# Patient Record
Sex: Female | Born: 1965
Health system: Southern US, Community
[De-identification: ages and names within clinical notes are randomized; demographics above are authoritative.]

## PROBLEM LIST (undated history)

## (undated) DIAGNOSIS — T4145XA Adverse effect of unspecified anesthetic, initial encounter: Secondary | ICD-10-CM

## (undated) DIAGNOSIS — M1990 Unspecified osteoarthritis, unspecified site: Secondary | ICD-10-CM

## (undated) DIAGNOSIS — D649 Anemia, unspecified: Secondary | ICD-10-CM

## (undated) DIAGNOSIS — R51 Headache: Secondary | ICD-10-CM

## (undated) DIAGNOSIS — E039 Hypothyroidism, unspecified: Secondary | ICD-10-CM

## (undated) DIAGNOSIS — J449 Chronic obstructive pulmonary disease, unspecified: Secondary | ICD-10-CM

## (undated) DIAGNOSIS — E079 Disorder of thyroid, unspecified: Secondary | ICD-10-CM

## (undated) DIAGNOSIS — I10 Essential (primary) hypertension: Secondary | ICD-10-CM

## (undated) DIAGNOSIS — T8859XA Other complications of anesthesia, initial encounter: Secondary | ICD-10-CM

## (undated) DIAGNOSIS — Z9289 Personal history of other medical treatment: Secondary | ICD-10-CM

## (undated) HISTORY — PX: WISDOM TOOTH EXTRACTION: SHX21

## (undated) HISTORY — PX: BACK SURGERY: SHX140

## (undated) HISTORY — PX: APPENDECTOMY: SHX54

## (undated) HISTORY — PX: TYMPANOSTOMY TUBE PLACEMENT: SHX32

## (undated) HISTORY — PX: CHOLECYSTECTOMY: SHX55

## (undated) HISTORY — PX: TONSILLECTOMY: SUR1361

---

## 2004-07-10 ENCOUNTER — Encounter: Admission: RE | Admit: 2004-07-10 | Discharge: 2004-07-10 | Payer: Self-pay | Admitting: Family Medicine

## 2004-08-09 ENCOUNTER — Encounter: Admission: RE | Admit: 2004-08-09 | Discharge: 2004-08-09 | Payer: Self-pay | Admitting: Family Medicine

## 2006-05-30 IMAGING — CR DG LUMBAR SPINE COMPLETE 4+V
5 series · 5 of 5 positions shown · non-contrast
Comparison: None.

CLINICAL DATA: Left-sided back pain. 
 LUMBAR SPINE COMPLETE

[view not recorded (1 of 5)]
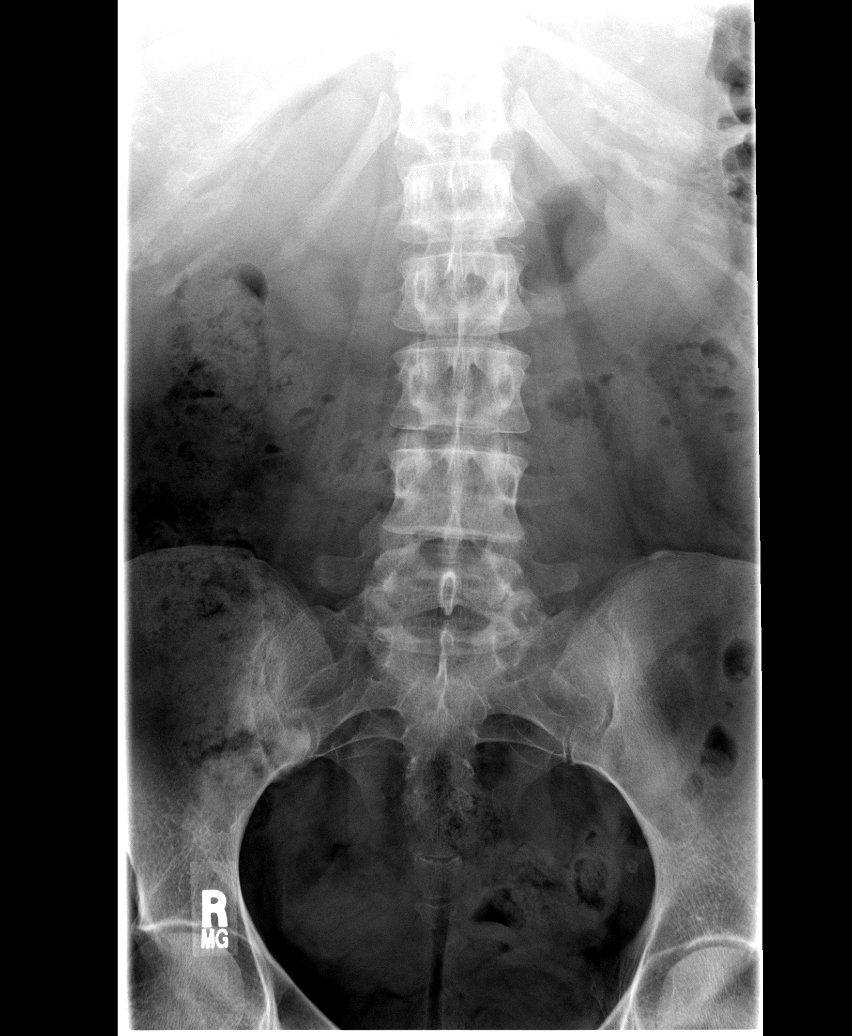

[view not recorded (2 of 5)]
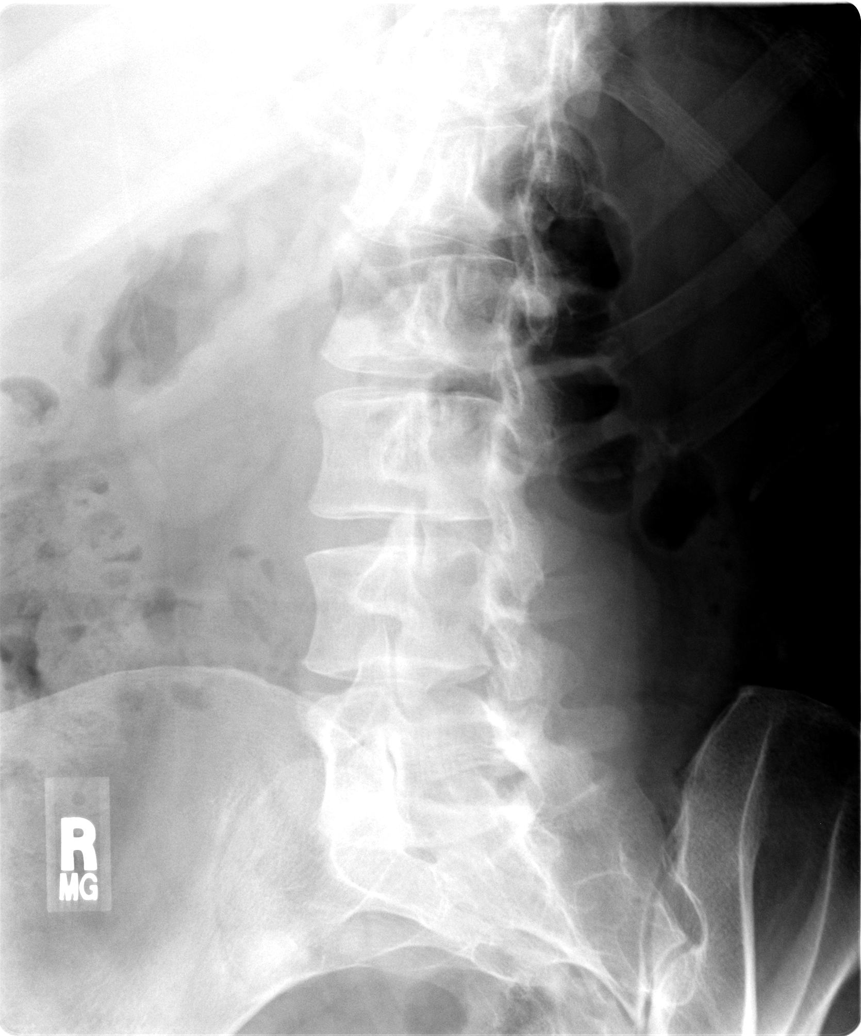

[view not recorded (3 of 5)]
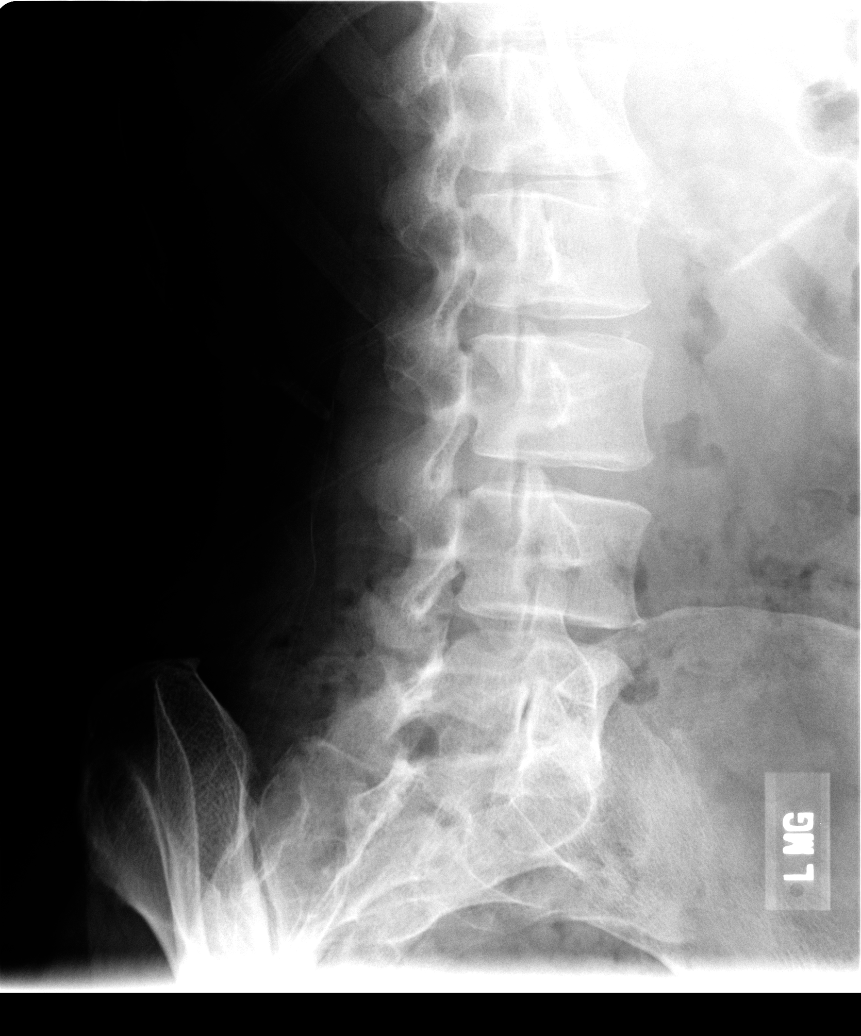

[view not recorded (4 of 5)]
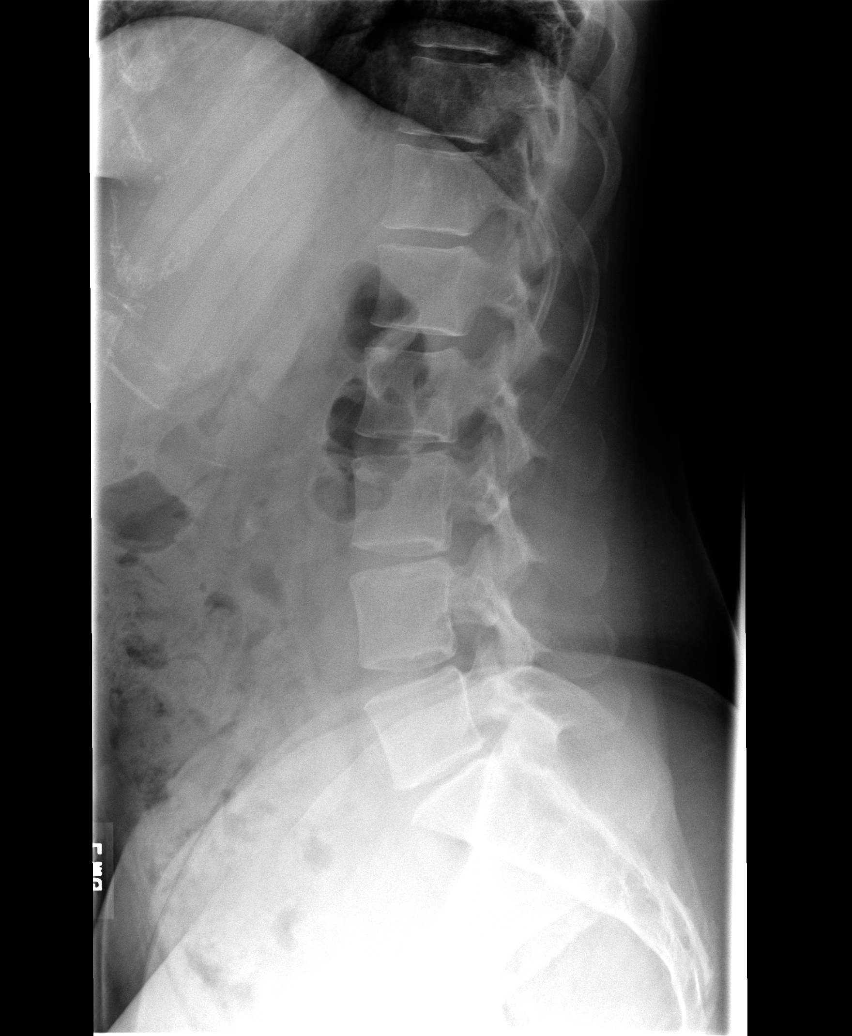

[view not recorded (5 of 5)]
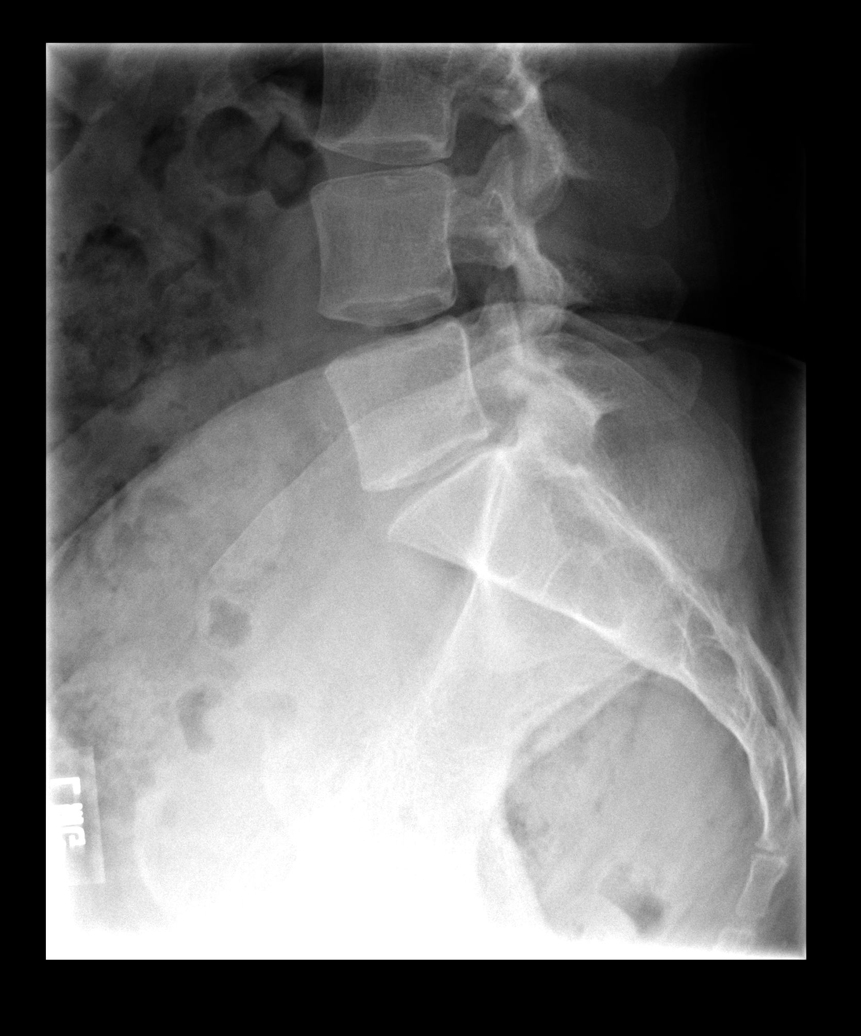

[5 of 5 positions shown; findings below may reference images not displayed]

There is no evidence of fracture. Alignment is normal. The intervertebral disk spaces are within normal limits and no other significant bone abnormalities are identified. 

 IMPRESSION

 Normal study.

## 2008-04-14 ENCOUNTER — Ambulatory Visit (HOSPITAL_COMMUNITY): Admission: RE | Admit: 2008-04-14 | Discharge: 2008-04-14 | Payer: Self-pay | Admitting: Family Medicine

## 2008-09-26 ENCOUNTER — Emergency Department (HOSPITAL_COMMUNITY): Admission: EM | Admit: 2008-09-26 | Discharge: 2008-09-26 | Payer: Self-pay | Admitting: *Deleted

## 2008-10-26 ENCOUNTER — Ambulatory Visit (HOSPITAL_COMMUNITY): Admission: RE | Admit: 2008-10-26 | Discharge: 2008-10-26 | Payer: Self-pay | Admitting: General Surgery

## 2008-10-26 ENCOUNTER — Encounter (INDEPENDENT_AMBULATORY_CARE_PROVIDER_SITE_OTHER): Payer: Self-pay | Admitting: General Surgery

## 2009-07-18 ENCOUNTER — Other Ambulatory Visit: Admission: RE | Admit: 2009-07-18 | Discharge: 2009-07-18 | Payer: Self-pay | Admitting: Obstetrics and Gynecology

## 2009-07-27 ENCOUNTER — Encounter: Admission: RE | Admit: 2009-07-27 | Discharge: 2009-07-27 | Payer: Self-pay | Admitting: Obstetrics and Gynecology

## 2010-04-15 ENCOUNTER — Emergency Department (HOSPITAL_COMMUNITY): Admission: EM | Admit: 2010-04-15 | Discharge: 2010-04-15 | Payer: Self-pay | Admitting: Family Medicine

## 2010-08-16 IMAGING — US US ABDOMEN COMPLETE
1 series · 14 of 25 positions shown · non-contrast
Comparison: None

CLINICAL DATA: Abdominal pain and nausea.

ABDOMEN ULTRASOUND
TECHNIQUE: Complete abdominal ultrasound examination was performed
including evaluation of the liver, gallbladder, bile ducts,
pancreas, kidneys, spleen, IVC, and abdominal aorta.

[Series 1: unknown · 0.33mm/px · 14 of 66 slices shown]
[im 1/66]
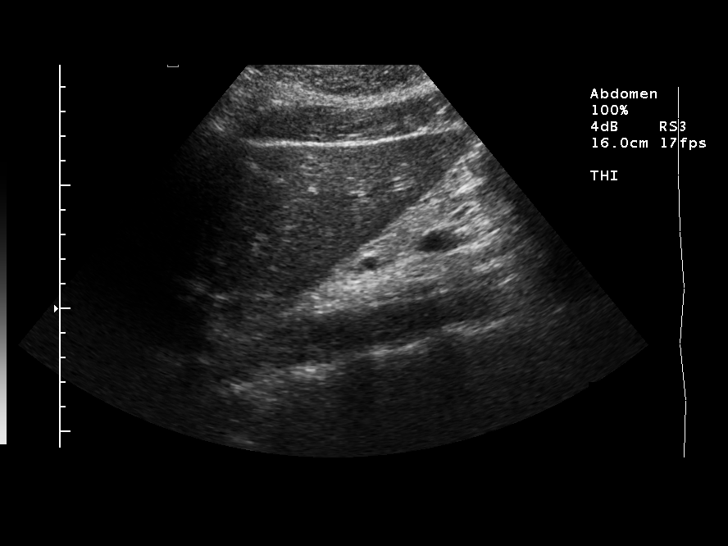
[im 6/66]
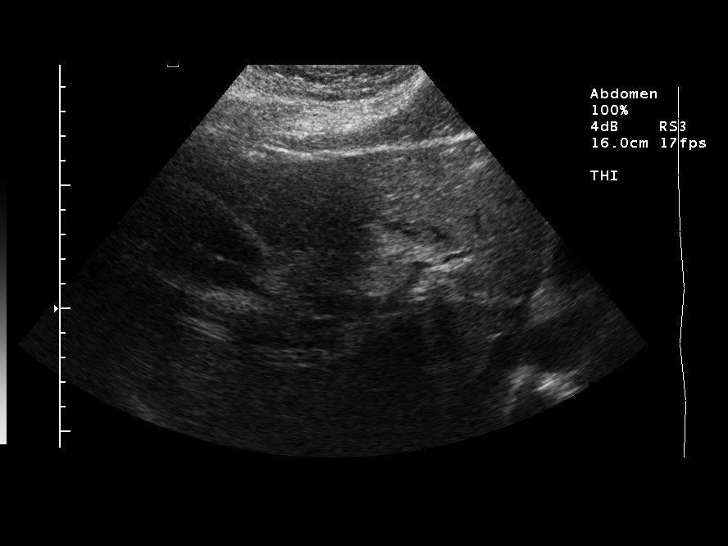
[im 11/66]
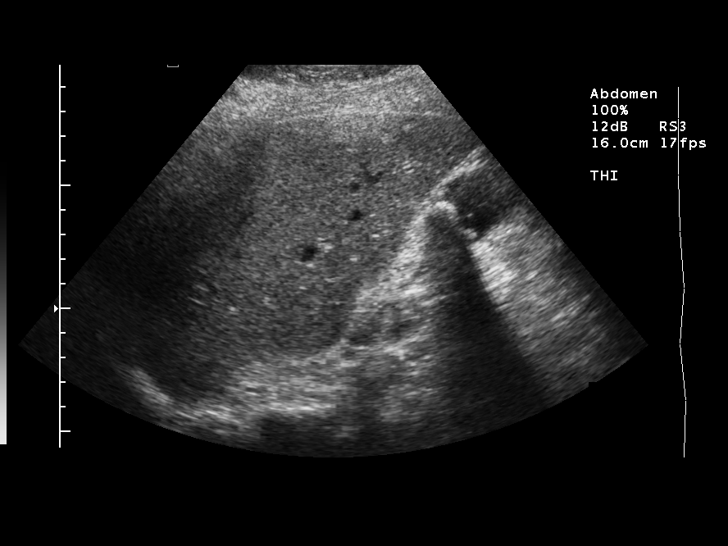
[im 17/66]
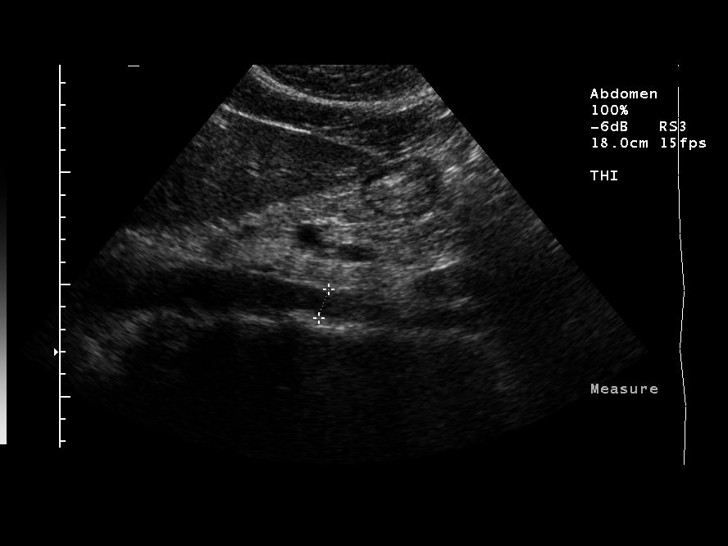
[im 22/66]
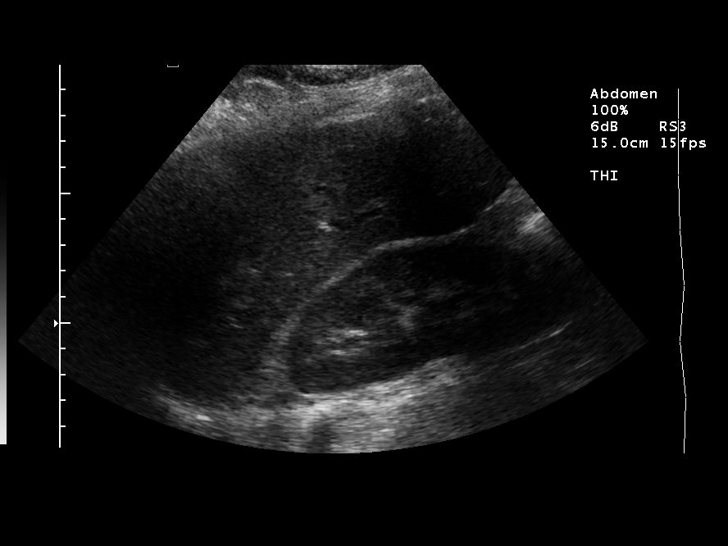
[im 25/66]
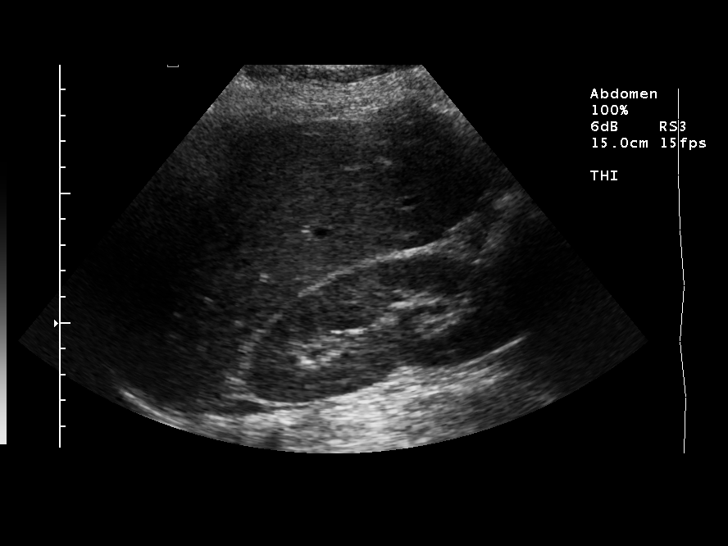
[im 30/66]
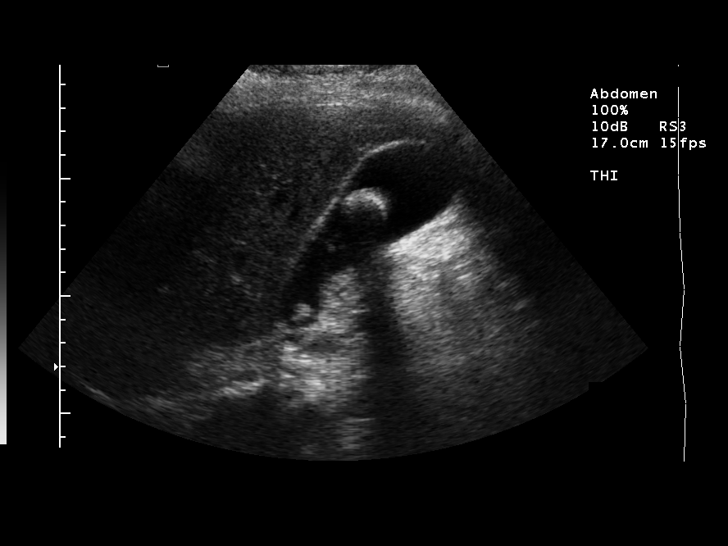
[im 36/66]
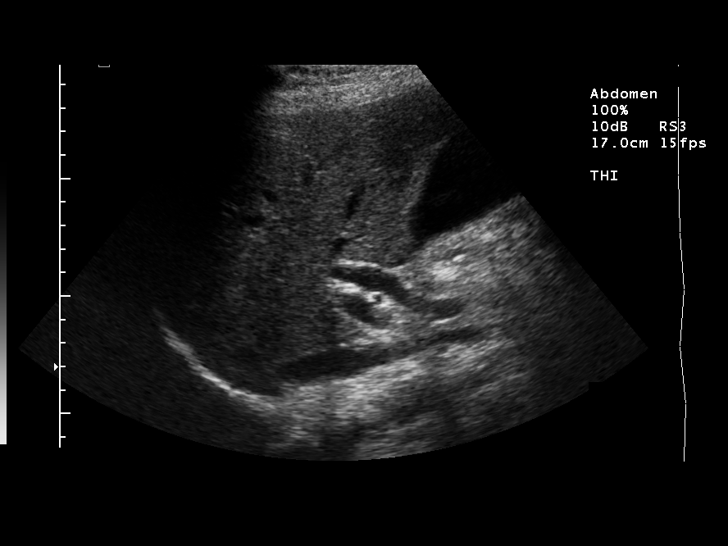
[im 41/66]
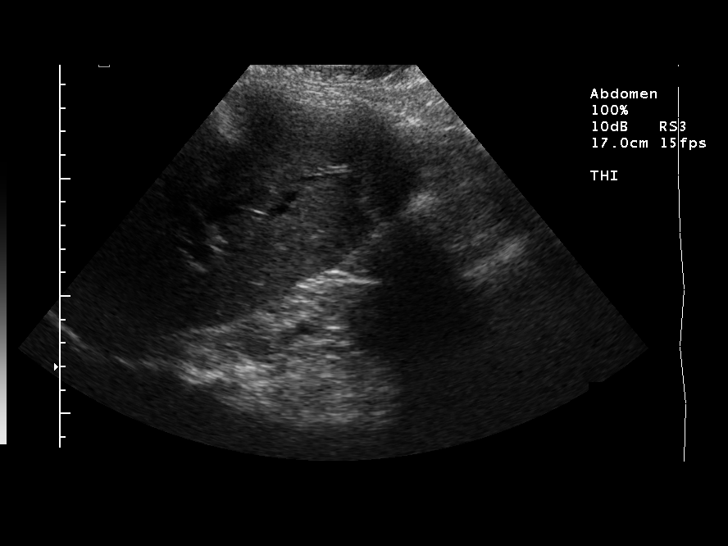
[im 44/66]
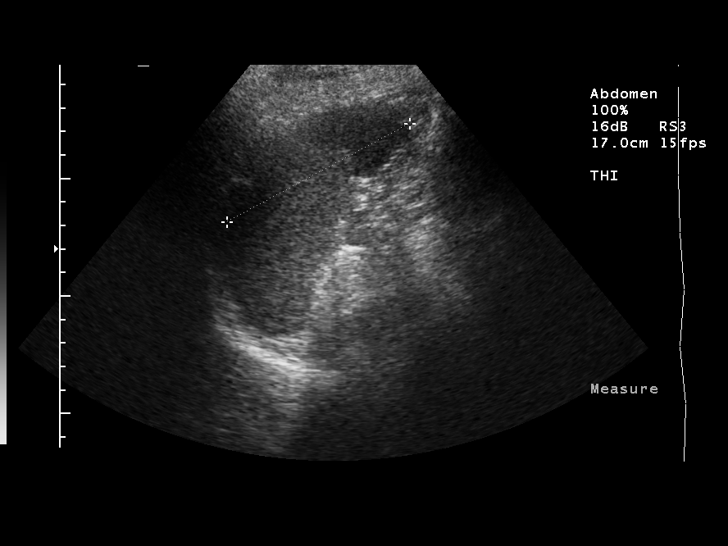
[im 49/66]
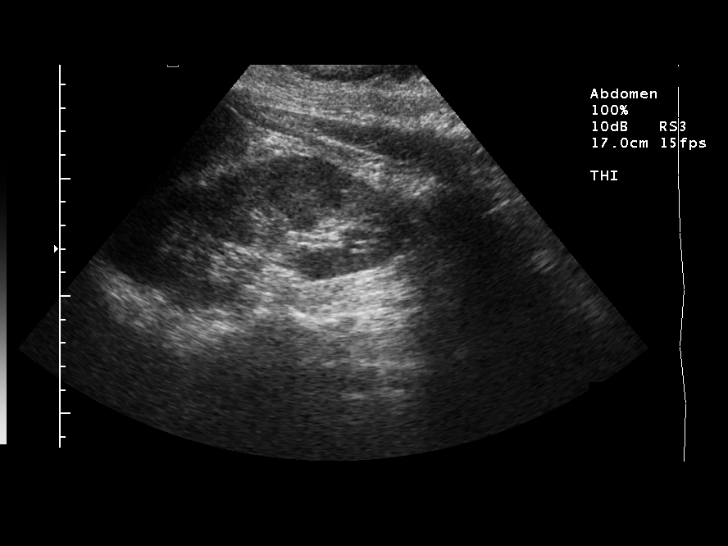
[im 55/66]
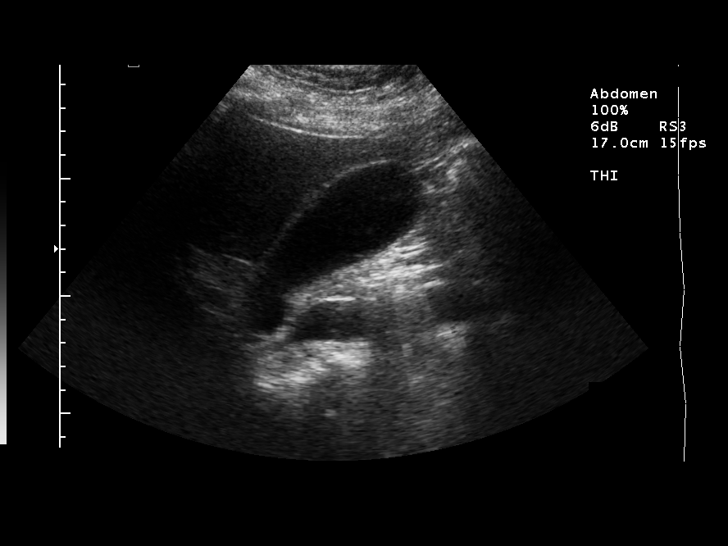
[im 60/66]
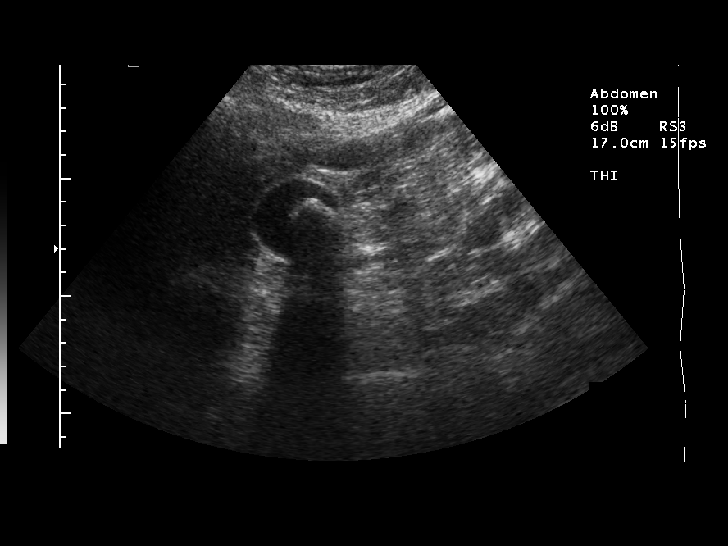
[im 66/66]
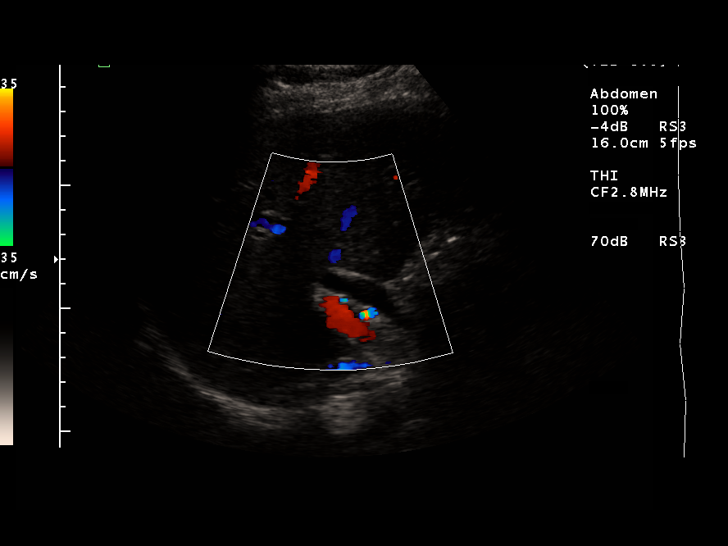

[14 of 25 positions shown; findings below may reference images not displayed]

FINDINGS: Multiple gallstones are noted with the largest gallstone
measuring 2 cm.
There is no evidence of gallbladder wall thickening,
pericholecystic fluid, or sonographic Murphy's sign.

There is dilatation of the CBD (9 mm) and mild intrahepatic biliary
dilatation without  identifiable cause.

The remainder of the liver is unremarkable.
The IVC, pancreas, spleen, kidneys, and abdominal aorta are
unremarkable.

There is no evidence of free fluid.
IMPRESSION: CBD and intrahepatic biliary dilatation without identifiable cause.
Choledocholithiasis are not identified , but not excluded
especially distally.

Cholelithiasis without evidence of acute cholecystitis.

## 2010-12-06 ENCOUNTER — Other Ambulatory Visit: Payer: Self-pay | Admitting: Obstetrics and Gynecology

## 2010-12-06 DIAGNOSIS — Z1231 Encounter for screening mammogram for malignant neoplasm of breast: Secondary | ICD-10-CM

## 2010-12-13 ENCOUNTER — Ambulatory Visit: Payer: Self-pay

## 2010-12-14 LAB — POCT PREGNANCY, URINE: Preg Test, Ur: NEGATIVE

## 2010-12-14 LAB — CBC
HCT: 36.7 % (ref 36.0–46.0)
Hemoglobin: 12 g/dL (ref 12.0–15.0)
MCH: 24.2 pg — ABNORMAL LOW (ref 26.0–34.0)
MCHC: 32.8 g/dL (ref 30.0–36.0)
MCV: 73.8 fL — ABNORMAL LOW (ref 78.0–100.0)
Platelets: 428 10*3/uL — ABNORMAL HIGH (ref 150–400)
RBC: 4.98 MIL/uL (ref 3.87–5.11)
RDW: 21.2 % — ABNORMAL HIGH (ref 11.5–15.5)
WBC: 13.9 10*3/uL — ABNORMAL HIGH (ref 4.0–10.5)

## 2010-12-14 LAB — COMPREHENSIVE METABOLIC PANEL
ALT: 34 U/L (ref 0–35)
AST: 25 U/L (ref 0–37)
Albumin: 4.5 g/dL (ref 3.5–5.2)
Alkaline Phosphatase: 121 U/L — ABNORMAL HIGH (ref 39–117)
BUN: 5 mg/dL — ABNORMAL LOW (ref 6–23)
CO2: 24 mEq/L (ref 19–32)
Calcium: 9.4 mg/dL (ref 8.4–10.5)
Chloride: 102 mEq/L (ref 96–112)
Creatinine, Ser: 0.56 mg/dL (ref 0.4–1.2)
GFR calc Af Amer: >60 mL/min (ref >60)
GFR calc non Af Amer: >60 mL/min (ref >60)
Glucose, Bld: 104 mg/dL — ABNORMAL HIGH (ref 70–99)
Potassium: 3.8 mEq/L (ref 3.5–5.1)
Sodium: 136 mEq/L (ref 135–145)
Total Bilirubin: 0.6 mg/dL (ref 0.3–1.2)
Total Protein: 7.3 g/dL (ref 6.0–8.3)

## 2010-12-14 LAB — DIFFERENTIAL
Basophils Absolute: 0 10*3/uL (ref 0.0–0.1)
Basophils Relative: 0 % (ref 0–1)
Eosinophils Absolute: 0.1 10*3/uL (ref 0.0–0.7)
Eosinophils Relative: 1 % (ref 0–5)
Lymphocytes Relative: 9 % — ABNORMAL LOW (ref 12–46)
Lymphs Abs: 1.3 10*3/uL (ref 0.7–4.0)
Monocytes Absolute: 0.6 10*3/uL (ref 0.1–1.0)
Monocytes Relative: 4 % (ref 3–12)
Neutro Abs: 11.9 10*3/uL — ABNORMAL HIGH (ref 1.7–7.7)
Neutrophils Relative %: 86 % — ABNORMAL HIGH (ref 43–77)

## 2010-12-14 LAB — POCT URINALYSIS DIP (DEVICE)
Bilirubin Urine: NEGATIVE
Glucose, UA: NEGATIVE mg/dL
Ketones, ur: NEGATIVE mg/dL
Nitrite: NEGATIVE
Protein, ur: NEGATIVE mg/dL
Specific Gravity, Urine: 1.02 (ref 1.005–1.030)
Urobilinogen, UA: 0.2 mg/dL (ref 0.0–1.0)
pH: 5.5 (ref 5.0–8.0)

## 2010-12-14 LAB — LIPASE, BLOOD: Lipase: 18 U/L (ref 11–59)

## 2010-12-20 ENCOUNTER — Ambulatory Visit
Admission: RE | Admit: 2010-12-20 | Discharge: 2010-12-20 | Disposition: A | Discharge status: Home / Self Care | Payer: BC Managed Care – PPO | Source: Ambulatory Visit | Attending: Obstetrics and Gynecology | Admitting: Obstetrics and Gynecology

## 2010-12-20 DIAGNOSIS — Z1231 Encounter for screening mammogram for malignant neoplasm of breast: Secondary | ICD-10-CM

## 2011-01-13 LAB — COMPREHENSIVE METABOLIC PANEL
ALT: 18 U/L (ref 0–35)
AST: 20 U/L (ref 0–37)
Albumin: 3.6 g/dL (ref 3.5–5.2)
Alkaline Phosphatase: 84 U/L (ref 39–117)
BUN: 13 mg/dL (ref 6–23)
CO2: 23 mEq/L (ref 19–32)
Calcium: 8.9 mg/dL (ref 8.4–10.5)
Chloride: 109 mEq/L (ref 96–112)
Creatinine, Ser: 0.42 mg/dL (ref 0.4–1.2)
GFR calc Af Amer: >60 mL/min (ref >60)
GFR calc non Af Amer: >60 mL/min (ref >60)
Glucose, Bld: 109 mg/dL — ABNORMAL HIGH (ref 70–99)
Potassium: 4 mEq/L (ref 3.5–5.1)
Sodium: 139 mEq/L (ref 135–145)
Total Bilirubin: 0.6 mg/dL (ref 0.3–1.2)
Total Protein: 5.8 g/dL — ABNORMAL LOW (ref 6.0–8.3)

## 2011-01-13 LAB — DIFFERENTIAL
Basophils Absolute: 0.1 10*3/uL (ref 0.0–0.1)
Basophils Relative: 1 % (ref 0–1)
Eosinophils Absolute: 0.3 10*3/uL (ref 0.0–0.7)
Eosinophils Relative: 4 % (ref 0–5)
Lymphocytes Relative: 25 % (ref 12–46)
Lymphs Abs: 1.6 10*3/uL (ref 0.7–4.0)
Monocytes Absolute: 0.5 10*3/uL (ref 0.1–1.0)
Monocytes Relative: 8 % (ref 3–12)
Neutro Abs: 4 10*3/uL (ref 1.7–7.7)
Neutrophils Relative %: 62 % (ref 43–77)

## 2011-01-13 LAB — CBC
HCT: 29 % — ABNORMAL LOW (ref 36.0–46.0)
Hemoglobin: 9.1 g/dL — ABNORMAL LOW (ref 12.0–15.0)
MCHC: 31.5 g/dL (ref 30.0–36.0)
MCV: 66.2 fL — ABNORMAL LOW (ref 78.0–100.0)
Platelets: 305 10*3/uL (ref 150–400)
RBC: 4.38 MIL/uL (ref 3.87–5.11)
RDW: 19.3 % — ABNORMAL HIGH (ref 11.5–15.5)
WBC: 6.5 10*3/uL (ref 4.0–10.5)

## 2011-01-13 LAB — PREGNANCY, URINE: Preg Test, Ur: NEGATIVE

## 2011-01-17 ENCOUNTER — Other Ambulatory Visit (HOSPITAL_COMMUNITY)
Admission: RE | Admit: 2011-01-17 | Discharge: 2011-01-17 | Disposition: A | Discharge status: Home / Self Care | Payer: BC Managed Care – PPO | Source: Ambulatory Visit | Attending: Obstetrics and Gynecology | Admitting: Obstetrics and Gynecology

## 2011-01-17 ENCOUNTER — Other Ambulatory Visit: Payer: Self-pay | Admitting: Obstetrics and Gynecology

## 2011-01-17 DIAGNOSIS — Z01419 Encounter for gynecological examination (general) (routine) without abnormal findings: Secondary | ICD-10-CM | POA: Insufficient documentation

## 2011-02-11 NOTE — Op Note (Signed)
NAMECORINNE, GOUCHER               ACCOUNT NO.:  [id number]   MEDICAL RECORD NO.:  [id number]          PATIENT TYPE:  AMB   LOCATION:  DAY                          FACILITY:  [facility]   PHYSICIAN:  Anselm Pancoast. Weatherly, M.D.DATE OF BIRTH:  01-21-1966   DATE OF PROCEDURE:  10/26/2008  DATE OF DISCHARGE:                               OPERATIVE REPORT   PREOPERATIVE DIAGNOSIS:  Chronic cholecystitis with stones, possible  passage of common duct stone recently.   POSTOPERATIVE DIAGNOSIS:  Chronic cholecystitis with stones, possible  passage of common duct stone recently.   OPERATIONS:  Laparoscopic cholecystectomy with cholangiogram.   SURGEON:  Consuello Bossier, M.D.   ASSISTANT:  Nurse.   HISTORY:  Korbin Notaro is a 45 year old Caucasian female who was  referred to our office after having episodes of significant epigastric  pain.  One of the episodes went definitely up to the chest.  She was  referred to our office.  She went to the emergency room when she was  having the episode of pain.  They did lab studies and an ultrasound.  The laboratory studies were abnormal as far as the liver function  studies.  The bilirubin was slightly elevated with the SGOT and SGPT  significantly elevated.  Her symptoms have gradually decreased and when  I saw her in the office I discussed with her that she probably had  passed a common duct stone with the pain, the pain up in the chest and  etc.  She has had previous back surgery by Dr. Phoebe Perch for a ruptured  disk and no evidence of any intra-abdominal surgery.  Her mother had  gallstones.  She is here today for the planned procedure.  She has been  seen by the [facility] after the emergency room and they did an  iron study.  Her iron saturation is low and she is anemic, but she has  not actually been given iron.  Dr. Manus Gunning is her regular physician.  I  recommended we proceed on with the laparoscopic cholecystectomy and  cholangiogram.  She  is aware that she may need an ERCP.   DESCRIPTION OF PROCEDURE:  The patient was taken to the operative suite.  She has got PAS stockings.  She had received 3 grams of Unasyn  preoperatively.  After induction of general anesthesia endotracheal tube  an oral tube was placed into the stomach and then the abdomen was  prepped with Betadine solution and draped in a sterile manner.  A small  incision was made below the umbilicus.  The fascia was identified and  picked up between two Kochers and carefully entered into the subfascial  area.  I placed a pursestring suture.  The peritoneum was not completely  entered at first.  I put my finger in and increased the opening just a  little bit and then repositioned the Hasson cannula.  The gallbladder is  distended.  It is not acutely inflamed.  She has got kind of a prominent  liver with the left lobe kind of hanging down, but I was able to get the  trocar  in the substernal area.  The two lateral 5 mm trocars were placed  and the gallbladder was grasped and retracted laterally.  Fortunately  the left lobe kind of hanging down and I could kind of work between the  lobes since I could not really elevate the prominence on the left.  Then  I could open up the peritoneum.  I identified the junction of the cystic  duct and the gallbladder and then put a clip flush with the cystic duct.  In the little cystic artery I put a single clip.  I then made a little  opening in the cystic bile duct proximal to the clip and there was a  soft stone in the cystic duct that was kind of milked out.  There was  good bile flow then.  The [facility] catheter was introduced, held in place  with a clip and then a cholangiogram obtained.  The cholangiogram showed  a very prominent common hepatic.  The intrahepatic radicals did fill.  There was probably about a 2 to 3 cm cystic duct.  On looking closely at  the distal area you could see that there are two; one stone is probably   about 2 mm and the other one is a little bit more prominent.  Then I  repositioned it and got some glucagon and gave her the glucagon, waited  5 minutes and then reinjected and then you could see the stone well  visualized.  Then the stone appeared to most likely pass since there was  no evidence of any other filling defect, there was good flow into the  duodenum.  I reviewed this with Dr. Audie Pinto and Dr. Deanne Coffer actually  read it and they feel that there is no common duct stone after the last  run and hopefully she has not going to need an ERCP.  The cystic duct  was doubly clipped proximally.  I did put a PDS Endoloop on it since the  common bile duct is a little more prominent in case there would be a  common duct stone also elevating the pressure.  I think that the best  route would be to not plan on doing an ERCP, repeat liver function  studies tomorrow and will do the ERCP if she is having further pain or  abnormal LFTs since I think that she passed these two little common duct  stones.  The cystic artery was doubly clipped proximally and distally  divided.  A little posterior branch was clipped under direct vision.  The peritoneum opened up and the dilated gallbladder freed from its bed  with good hemostasis.  I placed the gallbladder within an EndoCatch bag.  I switched the camera to the upper port and withdrew the bag containing  the gallbladder and stones.  The patient tolerated the procedure well.  The CO2 was released.  I put an additional figure-of-eight of 0 Vicryl  in the fascia at the umbilicus, a single stitch in the fascia and  subxiphoid area and had removed the little irrigating fluid.  She hopes  to go home afterwards.  The subcuticular wounds were closed with 4-0  Vicryl and Steri-Strips for skin.           ______________________________  Anselm Pancoast. Zachery Dakins, M.D.     WJW/MEDQ  D:  10/26/2008  T:  10/26/2008  Job:  [id number]   cc:   Bryan Lemma. Manus Gunning, M.D.   Fax: [phone]

## 2011-07-04 LAB — COMPREHENSIVE METABOLIC PANEL
ALT: 567 U/L — ABNORMAL HIGH (ref 0–35)
AST: 747 U/L — ABNORMAL HIGH (ref 0–37)
Albumin: 4.1 g/dL (ref 3.5–5.2)
Alkaline Phosphatase: 220 U/L — ABNORMAL HIGH (ref 39–117)
BUN: 7 mg/dL (ref 6–23)
CO2: 20 mEq/L (ref 19–32)
Calcium: 9.1 mg/dL (ref 8.4–10.5)
Chloride: 103 mEq/L (ref 96–112)
Creatinine, Ser: 0.45 mg/dL (ref 0.4–1.2)
GFR calc Af Amer: >60 mL/min (ref >60)
GFR calc non Af Amer: >60 mL/min (ref >60)
Glucose, Bld: 129 mg/dL — ABNORMAL HIGH (ref 70–99)
Potassium: 3.7 mEq/L (ref 3.5–5.1)
Sodium: 134 mEq/L — ABNORMAL LOW (ref 135–145)
Total Bilirubin: 2.2 mg/dL — ABNORMAL HIGH (ref 0.3–1.2)
Total Protein: 7 g/dL (ref 6.0–8.3)

## 2011-07-04 LAB — DIFFERENTIAL
Band Neutrophils: 0 % (ref 0–10)
Basophils Absolute: 0 10*3/uL (ref 0.0–0.1)
Basophils Relative: 0 % (ref 0–1)
Blasts: 0 %
Eosinophils Absolute: 0.1 10*3/uL (ref 0.0–0.7)
Eosinophils Relative: 2 % (ref 0–5)
Lymphocytes Relative: 15 % (ref 12–46)
Lymphs Abs: 1.1 10*3/uL (ref 0.7–4.0)
Metamyelocytes Relative: 0 %
Monocytes Absolute: 0.2 10*3/uL (ref 0.1–1.0)
Monocytes Relative: 3 % (ref 3–12)
Myelocytes: 0 %
Neutro Abs: 6 10*3/uL (ref 1.7–7.7)
Neutrophils Relative %: 80 % — ABNORMAL HIGH (ref 43–77)
Promyelocytes Absolute: 0 %
nRBC: 0 /100 WBC

## 2011-07-04 LAB — URINALYSIS, ROUTINE W REFLEX MICROSCOPIC
Glucose, UA: NEGATIVE mg/dL
Ketones, ur: NEGATIVE mg/dL
Nitrite: NEGATIVE
Protein, ur: NEGATIVE mg/dL
Specific Gravity, Urine: 1.013 (ref 1.005–1.030)
Urobilinogen, UA: 1 mg/dL (ref 0.0–1.0)
pH: 6.5 (ref 5.0–8.0)

## 2011-07-04 LAB — CBC
HCT: 31.8 % — ABNORMAL LOW (ref 36.0–46.0)
Hemoglobin: 10.1 g/dL — ABNORMAL LOW (ref 12.0–15.0)
MCHC: 31.8 g/dL (ref 30.0–36.0)
MCV: 65.4 fL — ABNORMAL LOW (ref 78.0–100.0)
Platelets: 434 10*3/uL — ABNORMAL HIGH (ref 150–400)
RBC: 4.86 MIL/uL (ref 3.87–5.11)
RDW: 19.6 % — ABNORMAL HIGH (ref 11.5–15.5)
WBC: 7.4 10*3/uL (ref 4.0–10.5)

## 2011-07-04 LAB — URINE MICROSCOPIC-ADD ON

## 2011-07-04 LAB — PREGNANCY, URINE: Preg Test, Ur: NEGATIVE

## 2011-07-04 LAB — LIPASE, BLOOD: Lipase: 15 U/L (ref 11–59)

## 2012-01-23 ENCOUNTER — Other Ambulatory Visit: Payer: Self-pay | Admitting: Obstetrics and Gynecology

## 2012-01-23 ENCOUNTER — Other Ambulatory Visit (HOSPITAL_COMMUNITY)
Admission: RE | Admit: 2012-01-23 | Discharge: 2012-01-23 | Disposition: A | Discharge status: Home / Self Care | Payer: BC Managed Care – PPO | Source: Ambulatory Visit | Attending: Obstetrics and Gynecology | Admitting: Obstetrics and Gynecology

## 2012-01-23 DIAGNOSIS — Z01419 Encounter for gynecological examination (general) (routine) without abnormal findings: Secondary | ICD-10-CM | POA: Insufficient documentation

## 2012-02-06 ENCOUNTER — Other Ambulatory Visit (HOSPITAL_COMMUNITY): Payer: Self-pay | Admitting: Radiology

## 2012-02-06 DIAGNOSIS — J449 Chronic obstructive pulmonary disease, unspecified: Secondary | ICD-10-CM

## 2012-02-13 ENCOUNTER — Encounter (HOSPITAL_COMMUNITY): Payer: BC Managed Care – PPO

## 2012-02-20 ENCOUNTER — Other Ambulatory Visit: Payer: Self-pay | Admitting: Obstetrics and Gynecology

## 2012-02-20 DIAGNOSIS — Z1231 Encounter for screening mammogram for malignant neoplasm of breast: Secondary | ICD-10-CM

## 2012-03-05 ENCOUNTER — Encounter (HOSPITAL_COMMUNITY): Payer: BC Managed Care – PPO

## 2012-03-19 ENCOUNTER — Encounter (HOSPITAL_COMMUNITY): Payer: BC Managed Care – PPO

## 2012-03-19 ENCOUNTER — Ambulatory Visit
Admission: RE | Admit: 2012-03-19 | Discharge: 2012-03-19 | Disposition: A | Discharge status: Home / Self Care | Payer: BC Managed Care – PPO | Source: Ambulatory Visit | Attending: Obstetrics and Gynecology | Admitting: Obstetrics and Gynecology

## 2012-03-19 DIAGNOSIS — Z1231 Encounter for screening mammogram for malignant neoplasm of breast: Secondary | ICD-10-CM

## 2012-04-02 ENCOUNTER — Other Ambulatory Visit (HOSPITAL_COMMUNITY): Payer: Self-pay | Admitting: Respiratory Therapy

## 2012-04-02 ENCOUNTER — Ambulatory Visit (HOSPITAL_COMMUNITY)
Admission: RE | Admit: 2012-04-02 | Discharge: 2012-04-02 | Disposition: A | Discharge status: Home / Self Care | Payer: BC Managed Care – PPO | Source: Ambulatory Visit | Attending: Family Medicine | Admitting: Family Medicine

## 2012-04-02 DIAGNOSIS — J4489 Other specified chronic obstructive pulmonary disease: Secondary | ICD-10-CM | POA: Insufficient documentation

## 2012-04-02 DIAGNOSIS — J449 Chronic obstructive pulmonary disease, unspecified: Secondary | ICD-10-CM | POA: Insufficient documentation

## 2012-04-02 MED ORDER — ALBUTEROL SULFATE (5 MG/ML) 0.5% IN NEBU
2.5000 mg | INHALATION_SOLUTION | Freq: Once | RESPIRATORY_TRACT | Status: AC
  Administered 2012-04-02: 2.5 mg via RESPIRATORY_TRACT

## 2012-07-11 ENCOUNTER — Emergency Department (HOSPITAL_COMMUNITY): Payer: BC Managed Care – PPO

## 2012-07-11 ENCOUNTER — Emergency Department (HOSPITAL_COMMUNITY)
Admission: EM | Admit: 2012-07-11 | Discharge: 2012-07-11 | Disposition: A | Discharge status: Home / Self Care | Payer: BC Managed Care – PPO | Attending: Emergency Medicine | Admitting: Emergency Medicine

## 2012-07-11 ENCOUNTER — Encounter (HOSPITAL_COMMUNITY): Payer: Self-pay | Admitting: Emergency Medicine

## 2012-07-11 DIAGNOSIS — S82899A Other fracture of unspecified lower leg, initial encounter for closed fracture: Secondary | ICD-10-CM

## 2012-07-11 DIAGNOSIS — M25579 Pain in unspecified ankle and joints of unspecified foot: Secondary | ICD-10-CM | POA: Insufficient documentation

## 2012-07-11 DIAGNOSIS — S82853A Displaced trimalleolar fracture of unspecified lower leg, initial encounter for closed fracture: Secondary | ICD-10-CM | POA: Insufficient documentation

## 2012-07-11 DIAGNOSIS — W010XXA Fall on same level from slipping, tripping and stumbling without subsequent striking against object, initial encounter: Secondary | ICD-10-CM | POA: Insufficient documentation

## 2012-07-11 HISTORY — DX: Essential (primary) hypertension: I10

## 2012-07-11 HISTORY — DX: Disorder of thyroid, unspecified: E07.9

## 2012-07-11 MED ORDER — FENTANYL CITRATE 0.05 MG/ML IJ SOLN
50.0000 ug | Freq: Once | INTRAMUSCULAR | Status: AC
  Administered 2012-07-11: 50 ug via INTRAVENOUS
  Filled 2012-07-11: qty 2

## 2012-07-11 MED ORDER — FENTANYL CITRATE 0.05 MG/ML IJ SOLN
25.0000 ug | Freq: Once | INTRAMUSCULAR | Status: AC
  Administered 2012-07-11: 25 ug via INTRAVENOUS
  Filled 2012-07-11: qty 2

## 2012-07-11 MED ORDER — LIDOCAINE HCL 1 % IJ SOLN
5.0000 mL | Freq: Once | INTRAMUSCULAR | Status: AC
  Administered 2012-07-11: 5 mL via INTRADERMAL
  Filled 2012-07-11: qty 20

## 2012-07-11 MED ORDER — HYDROCODONE-ACETAMINOPHEN 10-325 MG PO TABS: 1.0000 | ORAL_TABLET | Freq: Four times a day (QID) | ORAL | Status: DC | PRN

## 2012-07-11 MED ORDER — SODIUM CHLORIDE 0.9 % IV SOLN
Freq: Once | INTRAVENOUS | Status: AC
  Administered 2012-07-11: 01:00:00 via INTRAVENOUS

## 2012-07-11 NOTE — ED Notes (Signed)
Pt given 150mcg of fentanyl by EMS prior to arrival 

## 2012-07-11 NOTE — Progress Notes (Signed)
Orthopedic Tech Progress Note Patient Details:  [name] 12/16/1965 [id number]  Ortho Devices Type of Ortho Device: Short leg splint   [name] 07/11/2012, 1:53 AM

## 2012-07-11 NOTE — ED Notes (Signed)
Pt presents to the Ed via EMS with the c/o ankle pain.  Pain has Etoh on board.  Pt slipped in a field of wet grass and twisted her right ankle.  Pt was brought in on a spine board and cervical collar.  Pt. Removed cervical collar on her own and stated, "I don't care I just need to use the bathroom."  Patients family is at bedside.  Pt taken off the spineboard with assistance of DR. Jeraldine Loots and ED staff. Patient appears stable at this time.

## 2012-07-11 NOTE — ED Provider Notes (Signed)
History     CSN: [id number]  Arrival date & time 07/11/12  0011   First MD Initiated Contact with Patient 07/11/12 0007      Chief Complaint  Patient presents with  . Ankle Pain    (Consider location/radiation/quality/duration/timing/severity/associated sxs/prior treatment) HPI The patient presents with right ankle pain. Just prior to presentation the patient slipped on a wet grassy surface, felt her ankle give way, and since that time she said pain persistently in the ankle.  She's not been ambulatory, has not put weight on the extremity. She denies trauma to other body parts, any loss of consciousness, any other focal complaints. The patient's husband saw the event, he similarly denies any other trauma, loss of consciousness, changes in the patient's interactivity or behavior since the event. History reviewed. No pertinent past medical history.  History reviewed. No pertinent past surgical history.  History reviewed. No pertinent family history.  History  Substance Use Topics  . Smoking status: Not on file  . Smokeless tobacco: Not on file  . Alcohol Use: Not on file    OB History    Grav Para Term Preterm Abortions TAB SAB Ect Mult Living                  Review of Systems  All other systems reviewed and are negative.    Allergies  Review of patient's allergies indicates not on file.  Home Medications  No current outpatient prescriptions on file.  BP 128/87  Pulse 94  Temp 97.7 F (36.5 C) (Oral)  Resp 22  SpO2 96%  Physical Exam  Nursing note and vitals reviewed. Constitutional: She is oriented to person, place, and time. She appears well-developed and well-nourished. Cervical collar and backboard in place.  HENT:  Head: Normocephalic and atraumatic.  Nose: Nose normal.  Mouth/Throat: Uvula is midline and oropharynx is clear and moist.  Neck: Normal range of motion and full passive range of motion without pain. No spinous process tenderness and no  muscular tenderness present. No tracheal deviation present. No thyromegaly present.  Cardiovascular: Normal rate and intact distal pulses.   Pulmonary/Chest: Effort normal. No stridor. No respiratory distress.  Abdominal: She exhibits no distension.  Musculoskeletal:       Right ankle: She exhibits decreased range of motion and deformity. She exhibits no ecchymosis, no laceration and normal pulse. tenderness. Lateral malleolus, medial malleolus, AITFL, CF ligament and posterior TFL tenderness found. No proximal fibula tenderness found.  Lymphadenopathy:    She has no cervical adenopathy.  Neurological: She is alert and oriented to person, place, and time. No cranial nerve deficit. She exhibits normal muscle tone. Coordination normal.  Skin: Skin is warm and dry.  Psychiatric: She has a normal mood and affect.    ED Course  ORTHOPEDIC INJURY TREATMENT Date/Time: 07/11/2012 1:00 AM Performed by: Gerhard Munch Authorized by: Gerhard Munch Consent: Verbal consent obtained. Written consent not obtained. The procedure was performed in an emergent situation. Risks and benefits: risks, benefits and alternatives were discussed Consent given by: patient and spouse Patient identity confirmed: verbally with patient Time out: Immediately prior to procedure a "time out" was called to verify the correct patient, procedure, equipment, support staff and site/side marked as required. Injury location: ankle Location details: right ankle Injury type: fracture-dislocation Fracture type: trimalleolar Pre-procedure neurovascular assessment: neurovascularly intact Pre-procedure distal perfusion: normal Pre-procedure neurological function: normal Pre-procedure range of motion: reduced Local anesthesia used: yes Anesthesia: hematoma block Local anesthetic: lidocaine 1% with epinephrine Anesthetic  total: 5 ml Patient sedated: no Manipulation performed: yes Skeletal traction used: yes Reduction  successful: yes X-ray confirmed reduction: yes Immobilization: splint and crutches Splint type: short leg Supplies used: Ortho-Glass Post-procedure neurovascular assessment: post-procedure neurovascularly intact Post-procedure distal perfusion: normal Post-procedure neurological function: normal Post-procedure range of motion: unchanged Patient tolerance: Patient tolerated the procedure well with no immediate complications.   (including critical care time)  Labs Reviewed - No data to display No results found.   No diagnosis found.  I tried to contact our orthopedic colleagues, several times.  MDM  This previously well female presents with new right ankle pain following a fall.  On exam she is uncomfortable appearing, though in no distress.  The patient is a notable deformity.  The patient's fracture dislocation was reduced successfully with physical manipulation.  She was placed in a splint, provided crutches, and discharged to follow up with our orthopedists for prompt reevaluation and consideration of surgical fixation.   Gerhard Munch, MD 07/11/12 [phone]

## 2012-07-11 NOTE — ED Notes (Signed)
Bed:RESB<BR> Expected date:<BR> Expected time:<BR> Means of arrival:<BR> Comments:<BR> EMS

## 2012-07-13 ENCOUNTER — Encounter (HOSPITAL_COMMUNITY): Payer: Self-pay

## 2012-07-15 ENCOUNTER — Other Ambulatory Visit (HOSPITAL_COMMUNITY): Payer: Self-pay | Admitting: Orthopaedic Surgery

## 2012-07-16 ENCOUNTER — Encounter (HOSPITAL_COMMUNITY)
Admission: RE | Admit: 2012-07-16 | Discharge: 2012-07-16 | Disposition: A | Discharge status: Home / Self Care | Payer: BC Managed Care – PPO | Source: Ambulatory Visit | Attending: Orthopaedic Surgery | Admitting: Orthopaedic Surgery

## 2012-07-16 ENCOUNTER — Encounter (HOSPITAL_COMMUNITY): Payer: Self-pay

## 2012-07-16 HISTORY — DX: Hypothyroidism, unspecified: E03.9

## 2012-07-16 HISTORY — DX: Anemia, unspecified: D64.9

## 2012-07-16 HISTORY — DX: Headache: R51

## 2012-07-16 HISTORY — DX: Unspecified osteoarthritis, unspecified site: M19.90

## 2012-07-16 HISTORY — DX: Adverse effect of unspecified anesthetic, initial encounter: T41.45XA

## 2012-07-16 HISTORY — DX: Chronic obstructive pulmonary disease, unspecified: J44.9

## 2012-07-16 HISTORY — DX: Personal history of other medical treatment: Z92.89

## 2012-07-16 HISTORY — DX: Other complications of anesthesia, initial encounter: T88.59XA

## 2012-07-16 LAB — BASIC METABOLIC PANEL
BUN: 11 mg/dL (ref 6–23)
Chloride: 99 mEq/L (ref 96–112)
Glucose, Bld: 101 mg/dL — ABNORMAL HIGH (ref 70–99)
Potassium: 3.7 mEq/L (ref 3.5–5.1)

## 2012-07-16 LAB — CBC
HCT: 40.8 % (ref 36.0–46.0)
Hemoglobin: 13.9 g/dL (ref 12.0–15.0)
RBC: 4.5 MIL/uL (ref 3.87–5.11)
WBC: 10.8 10*3/uL — ABNORMAL HIGH (ref 4.0–10.5)

## 2012-07-16 LAB — HCG, SERUM, QUALITATIVE: Preg, Serum: NEGATIVE

## 2012-07-16 NOTE — Pre-Procedure Instructions (Signed)
20 [name]  07/16/2012   Your procedure is scheduled on:  Monday, October 21st.  Report to [facility] at 1:40 PM.  Call this number if you have problems the morning of surgery: [phone]   Remember:   Do not eat food or anything to drink:After Midnight.      Take these medicines the morning of surgery with A SIP OF WATER: Levothyroxine (Synthroid).  May take Hydrocodone-Acetaminophen) if needed.  Stop taking Meloxicam (Mobic) and Excedrin Migraine.  Do not wear jewelry, make-up or nail polish.  Do not wear lotions, powders, or perfumes. You may wear deodorant.  Do not shave 48 hours prior to surgery. Men may shave face and neck.  Do not bring valuables to the hospital.  Contacts, dentures or bridgework may not be worn into surgery.  Leave suitcase in the car. After surgery it may be brought to your room.  For patients admitted to the hospital, checkout time is 11:00 AM the day of discharge.   Patients discharged the day of surgery will not be allowed to drive home.  Name and phone number of your driver: _________  Special Instructions: Shower using CHG 2 nights before surgery and the night before surgery.  If you shower the day of surgery use CHG.  Use special wash - you have one bottle of CHG for all showers.  You should use approximately 1/3 of the bottle for each shower.   Please read over the following fact sheets that you were given: Pain Booklet, Coughing and Deep Breathing and Surgical Site Infection Prevention

## 2012-07-16 NOTE — H&P (Signed)
  [facility]   A Division of [employer]   [address], [place], [place] [address] Telephone: [phone]  Fax: [phone]     PATIENT: [name], [name]   MR#: [id number]  DOB: [birth year]   Visit Date: 07/13/2012     CHIEF COMPLAINT:   Fall on wet grass with right bimalleolar ankle fracture.   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  She was seen at [facility] ER, had a reduction and placed in a posterior splint.  She had hydrocodone for pain.  She has been keeping it elevated, has crutches.  No past history of injury to the ankle.     CURRENT MEDICATIONS:  She is on levothyroxine 150 mcg 1 p.o. daily.  Meloxicam 15 mg p.o. daily, lisinopril 10 mg 1 p.o. daily for hypertension and hydrocodone 10/325, 1 p.o. q.4-6 p.r.n.      PAST MEDICAL/SURGICAL HISTORY:  She has had previous back surgery lumbar and about 10 years ago also had her gallbladder removed.   SOCIAL HISTORY:  Married to her husband [name].  She smokes 1/2 pack per day, occasionally drinks once a week.     FAMILY HISTORY:  Positive for diabetes, heart disease, lung disease, breast and pancreatic cancer as well as hypertension.   REVIEW OF SYSTEMS:  Positive for acid reflux, arthritis, hypertension, migraines, thyroid condition and ulcers.   PHYSICAL EXAMINATION:  The patient is alert and oriented, WD, WN, NAD.  Good visual acuity.  Cervical range of motion is full.  No accessory muscle inspiratory effort.  No supraclavicular lymphadenopathy.  Lungs are clear.  Heart:  Regular rate and rhythm.  Abdomen is soft and nontender.  Capillary refill is normal.  Knee range of motion is full.  The opposite ankle is normal.   ASSESSMENT/DIAGNOSIS:  Displaced bimalleolar ankle fracture.   PLAN:  ORIF.  I will set this up on Monday, overnight stay.  Planned  procedure discussed.  All questions answered.           Mark C. Ophelia Charter, M.D.    Auto-Authenticated by Veverly Fells. Ophelia Charter, M.D.

## 2012-07-18 MED ORDER — CEFAZOLIN SODIUM-DEXTROSE 2-3 GM-% IV SOLR
2.0000 g | INTRAVENOUS | Status: AC
  Administered 2012-07-19: 2 g via INTRAVENOUS
  Filled 2012-07-18: qty 50

## 2012-07-19 ENCOUNTER — Ambulatory Visit (HOSPITAL_COMMUNITY)
Admission: RE | Admit: 2012-07-19 | Discharge: 2012-07-20 | Disposition: A | Discharge status: Home / Self Care | Payer: BC Managed Care – PPO | Source: Ambulatory Visit | Attending: Orthopaedic Surgery | Admitting: Orthopaedic Surgery

## 2012-07-19 ENCOUNTER — Ambulatory Visit (HOSPITAL_COMMUNITY): Payer: BC Managed Care – PPO | Admitting: Anesthesiology

## 2012-07-19 ENCOUNTER — Encounter (HOSPITAL_COMMUNITY)
Admission: RE | Disposition: A | Discharge status: Home / Self Care | Payer: Self-pay | Source: Ambulatory Visit | Attending: Orthopaedic Surgery

## 2012-07-19 ENCOUNTER — Encounter (HOSPITAL_COMMUNITY): Payer: Self-pay | Admitting: Anesthesiology

## 2012-07-19 ENCOUNTER — Ambulatory Visit (HOSPITAL_COMMUNITY): Payer: BC Managed Care – PPO

## 2012-07-19 DIAGNOSIS — S82843A Displaced bimalleolar fracture of unspecified lower leg, initial encounter for closed fracture: Secondary | ICD-10-CM | POA: Insufficient documentation

## 2012-07-19 DIAGNOSIS — Z8711 Personal history of peptic ulcer disease: Secondary | ICD-10-CM | POA: Insufficient documentation

## 2012-07-19 DIAGNOSIS — E079 Disorder of thyroid, unspecified: Secondary | ICD-10-CM | POA: Insufficient documentation

## 2012-07-19 DIAGNOSIS — Z01818 Encounter for other preprocedural examination: Secondary | ICD-10-CM | POA: Insufficient documentation

## 2012-07-19 DIAGNOSIS — M129 Arthropathy, unspecified: Secondary | ICD-10-CM | POA: Insufficient documentation

## 2012-07-19 DIAGNOSIS — Z01810 Encounter for preprocedural cardiovascular examination: Secondary | ICD-10-CM | POA: Insufficient documentation

## 2012-07-19 DIAGNOSIS — Z01812 Encounter for preprocedural laboratory examination: Secondary | ICD-10-CM | POA: Insufficient documentation

## 2012-07-19 DIAGNOSIS — S82841A Displaced bimalleolar fracture of right lower leg, initial encounter for closed fracture: Secondary | ICD-10-CM | POA: Diagnosis present

## 2012-07-19 DIAGNOSIS — K219 Gastro-esophageal reflux disease without esophagitis: Secondary | ICD-10-CM | POA: Insufficient documentation

## 2012-07-19 DIAGNOSIS — W010XXA Fall on same level from slipping, tripping and stumbling without subsequent striking against object, initial encounter: Secondary | ICD-10-CM | POA: Insufficient documentation

## 2012-07-19 DIAGNOSIS — I10 Essential (primary) hypertension: Secondary | ICD-10-CM | POA: Insufficient documentation

## 2012-07-19 DIAGNOSIS — G43909 Migraine, unspecified, not intractable, without status migrainosus: Secondary | ICD-10-CM | POA: Insufficient documentation

## 2012-07-19 DIAGNOSIS — F17200 Nicotine dependence, unspecified, uncomplicated: Secondary | ICD-10-CM | POA: Insufficient documentation

## 2012-07-19 HISTORY — PX: ORIF ANKLE FRACTURE: SHX5408

## 2012-07-19 LAB — COMPREHENSIVE METABOLIC PANEL
AST: 52 U/L — ABNORMAL HIGH (ref 0–37)
CO2: 22 mEq/L (ref 19–32)
Calcium: 9.4 mg/dL (ref 8.4–10.5)
Creatinine, Ser: 0.45 mg/dL — ABNORMAL LOW (ref 0.50–1.10)
GFR calc non Af Amer: >90 mL/min (ref >90)

## 2012-07-19 SURGERY — OPEN REDUCTION INTERNAL FIXATION (ORIF) ANKLE FRACTURE
Anesthesia: General | Site: Leg Upper | Laterality: Right | Wound class: Clean

## 2012-07-19 MED ORDER — METHOCARBAMOL 500 MG PO TABS
500.0000 mg | ORAL_TABLET | Freq: Four times a day (QID) | ORAL | Status: DC | PRN
  Administered 2012-07-20: 500 mg via ORAL
  Filled 2012-07-19 (×2): qty 1

## 2012-07-19 MED ORDER — ZOLPIDEM TARTRATE 5 MG PO TABS: 5.0000 mg | ORAL_TABLET | Freq: Every evening | ORAL | Status: DC | PRN

## 2012-07-19 MED ORDER — DOCUSATE SODIUM 100 MG PO CAPS
100.0000 mg | ORAL_CAPSULE | Freq: Two times a day (BID) | ORAL | Status: DC
  Administered 2012-07-19: 100 mg via ORAL
  Filled 2012-07-19 (×3): qty 1

## 2012-07-19 MED ORDER — KCL IN DEXTROSE-NACL 20-5-0.45 MEQ/L-%-% IV SOLN
INTRAVENOUS | Status: DC
  Administered 2012-07-19: 1000 mL via INTRAVENOUS
  Filled 2012-07-19 (×3): qty 1000

## 2012-07-19 MED ORDER — OXYCODONE-ACETAMINOPHEN 5-325 MG PO TABS
1.0000 | ORAL_TABLET | ORAL | Status: DC | PRN
  Administered 2012-07-19 – 2012-07-20 (×2): 1 via ORAL
  Filled 2012-07-19: qty 1

## 2012-07-19 MED ORDER — FENTANYL CITRATE 0.05 MG/ML IJ SOLN
INTRAMUSCULAR | Status: DC | PRN
  Administered 2012-07-19 (×2): 50 ug via INTRAVENOUS
  Administered 2012-07-19: 100 ug via INTRAVENOUS
  Administered 2012-07-19 (×2): 50 ug via INTRAVENOUS
  Administered 2012-07-19: 100 ug via INTRAVENOUS
  Administered 2012-07-19 (×2): 50 ug via INTRAVENOUS

## 2012-07-19 MED ORDER — LISINOPRIL 10 MG PO TABS
10.0000 mg | ORAL_TABLET | Freq: Every day | ORAL | Status: DC
  Filled 2012-07-19: qty 1

## 2012-07-19 MED ORDER — PROPOFOL 10 MG/ML IV BOLUS
INTRAVENOUS | Status: DC | PRN
  Administered 2012-07-19: 40 mg via INTRAVENOUS
  Administered 2012-07-19: 200 mg via INTRAVENOUS

## 2012-07-19 MED ORDER — KCL IN DEXTROSE-NACL 20-5-0.45 MEQ/L-%-% IV SOLN
INTRAVENOUS | Status: AC
  Filled 2012-07-19: qty 1000

## 2012-07-19 MED ORDER — BISACODYL 10 MG RE SUPP: 10.0000 mg | Freq: Every day | RECTAL | Status: DC | PRN

## 2012-07-19 MED ORDER — 0.9 % SODIUM CHLORIDE (POUR BTL) OPTIME
TOPICAL | Status: DC | PRN
  Administered 2012-07-19: 1000 mL

## 2012-07-19 MED ORDER — DEXAMETHASONE SODIUM PHOSPHATE 4 MG/ML IJ SOLN
INTRAMUSCULAR | Status: DC | PRN
  Administered 2012-07-19: 8 mg via INTRAVENOUS

## 2012-07-19 MED ORDER — MIDAZOLAM HCL 5 MG/5ML IJ SOLN
INTRAMUSCULAR | Status: DC | PRN
  Administered 2012-07-19 (×2): 1 mg via INTRAVENOUS

## 2012-07-19 MED ORDER — CEFAZOLIN SODIUM 1-5 GM-% IV SOLN
1.0000 g | Freq: Four times a day (QID) | INTRAVENOUS | Status: DC
  Administered 2012-07-19 – 2012-07-20 (×2): 1 g via INTRAVENOUS
  Filled 2012-07-19 (×3): qty 50

## 2012-07-19 MED ORDER — HYDROCODONE-ACETAMINOPHEN 5-325 MG PO TABS
1.0000 | ORAL_TABLET | ORAL | Status: DC | PRN
  Administered 2012-07-20: 2 via ORAL
  Filled 2012-07-19: qty 2

## 2012-07-19 MED ORDER — ASPIRIN 325 MG PO TABS
325.0000 mg | ORAL_TABLET | Freq: Every day | ORAL | Status: DC
  Administered 2012-07-19: 325 mg via ORAL
  Filled 2012-07-19 (×2): qty 1

## 2012-07-19 MED ORDER — METOCLOPRAMIDE HCL 5 MG/ML IJ SOLN: 5.0000 mg | Freq: Three times a day (TID) | INTRAMUSCULAR | Status: DC | PRN

## 2012-07-19 MED ORDER — METHOCARBAMOL 500 MG PO TABS: 500.0000 mg | ORAL_TABLET | Freq: Four times a day (QID) | ORAL | Status: DC

## 2012-07-19 MED ORDER — BUPIVACAINE HCL (PF) 0.25 % IJ SOLN
INTRAMUSCULAR | Status: AC
  Filled 2012-07-19: qty 30

## 2012-07-19 MED ORDER — OXYCODONE HCL 5 MG/5ML PO SOLN: 5.0000 mg | Freq: Once | ORAL | Status: DC | PRN

## 2012-07-19 MED ORDER — OXYCODONE-ACETAMINOPHEN 5-325 MG PO TABS: 2.0000 | ORAL_TABLET | ORAL | Status: DC | PRN

## 2012-07-19 MED ORDER — METHOCARBAMOL 100 MG/ML IJ SOLN
500.0000 mg | Freq: Four times a day (QID) | INTRAVENOUS | Status: DC | PRN
  Filled 2012-07-19: qty 5

## 2012-07-19 MED ORDER — LACTATED RINGERS IV SOLN
INTRAVENOUS | Status: DC
  Administered 2012-07-19 (×3): via INTRAVENOUS

## 2012-07-19 MED ORDER — HYDROMORPHONE HCL PF 1 MG/ML IJ SOLN
INTRAMUSCULAR | Status: AC
  Filled 2012-07-19: qty 1

## 2012-07-19 MED ORDER — LEVOTHYROXINE SODIUM 150 MCG PO TABS
150.0000 ug | ORAL_TABLET | Freq: Every day | ORAL | Status: DC
  Filled 2012-07-19: qty 1

## 2012-07-19 MED ORDER — ONDANSETRON HCL 4 MG PO TABS: 4.0000 mg | ORAL_TABLET | Freq: Four times a day (QID) | ORAL | Status: DC | PRN

## 2012-07-19 MED ORDER — ONDANSETRON HCL 4 MG/2ML IJ SOLN
4.0000 mg | Freq: Four times a day (QID) | INTRAMUSCULAR | Status: DC | PRN
  Administered 2012-07-19: 4 mg via INTRAVENOUS
  Filled 2012-07-19: qty 2

## 2012-07-19 MED ORDER — SENNOSIDES-DOCUSATE SODIUM 8.6-50 MG PO TABS: 1.0000 | ORAL_TABLET | Freq: Every evening | ORAL | Status: DC | PRN

## 2012-07-19 MED ORDER — METOCLOPRAMIDE HCL 10 MG PO TABS: 5.0000 mg | ORAL_TABLET | Freq: Three times a day (TID) | ORAL | Status: DC | PRN

## 2012-07-19 MED ORDER — DIPHENHYDRAMINE HCL 12.5 MG/5ML PO ELIX: 12.5000 mg | ORAL_SOLUTION | ORAL | Status: DC | PRN

## 2012-07-19 MED ORDER — ASPIRIN-ACETAMINOPHEN-CAFFEINE 250-250-65 MG PO TABS
1.0000 | ORAL_TABLET | Freq: Every day | ORAL | Status: DC | PRN
  Filled 2012-07-19: qty 1

## 2012-07-19 MED ORDER — METOCLOPRAMIDE HCL 5 MG/ML IJ SOLN: 10.0000 mg | Freq: Once | INTRAMUSCULAR | Status: DC | PRN

## 2012-07-19 MED ORDER — HYDROMORPHONE HCL PF 1 MG/ML IJ SOLN
0.2500 mg | INTRAMUSCULAR | Status: DC | PRN
  Administered 2012-07-19 (×4): 0.5 mg via INTRAVENOUS

## 2012-07-19 MED ORDER — MELOXICAM 15 MG PO TABS
15.0000 mg | ORAL_TABLET | Freq: Every day | ORAL | Status: DC
  Administered 2012-07-19: 15 mg via ORAL
  Filled 2012-07-19 (×2): qty 1

## 2012-07-19 MED ORDER — BUPIVACAINE HCL (PF) 0.25 % IJ SOLN
INTRAMUSCULAR | Status: DC | PRN
  Administered 2012-07-19: 7 mL

## 2012-07-19 MED ORDER — OXYCODONE HCL 5 MG PO TABS: 5.0000 mg | ORAL_TABLET | Freq: Once | ORAL | Status: DC | PRN

## 2012-07-19 MED ORDER — OXYCODONE-ACETAMINOPHEN 5-325 MG PO TABS
ORAL_TABLET | ORAL | Status: AC
  Filled 2012-07-19: qty 2

## 2012-07-19 MED ORDER — MORPHINE SULFATE 2 MG/ML IJ SOLN
1.0000 mg | INTRAMUSCULAR | Status: DC | PRN
  Administered 2012-07-19: 1 mg via INTRAVENOUS
  Filled 2012-07-19: qty 1

## 2012-07-19 MED ORDER — PHENYLEPHRINE HCL 10 MG/ML IJ SOLN
INTRAMUSCULAR | Status: DC | PRN
  Administered 2012-07-19: 80 ug via INTRAVENOUS

## 2012-07-19 MED ORDER — ONDANSETRON HCL 4 MG/2ML IJ SOLN
INTRAMUSCULAR | Status: DC | PRN
  Administered 2012-07-19: 4 mg via INTRAVENOUS

## 2012-07-19 SURGICAL SUPPLY — 59 items
BANDAGE ELASTIC 4 VELCRO ST LF (GAUZE/BANDAGES/DRESSINGS) ×2 IMPLANT
BANDAGE ELASTIC 6 VELCRO ST LF (GAUZE/BANDAGES/DRESSINGS) ×2 IMPLANT
BANDAGE ESMARK 6X9 LF (GAUZE/BANDAGES/DRESSINGS) IMPLANT
BIT DRILL 2.5X2.75 QC CALB (BIT) ×2 IMPLANT
BNDG ESMARK 6X9 LF (GAUZE/BANDAGES/DRESSINGS)
CLOTH BEACON ORANGE TIMEOUT ST (SAFETY) ×2 IMPLANT
COVER MAYO STAND STRL (DRAPES) IMPLANT
COVER SURGICAL LIGHT HANDLE (MISCELLANEOUS) ×2 IMPLANT
CUFF TOURNIQUET SINGLE 34IN LL (TOURNIQUET CUFF) ×2 IMPLANT
CUFF TOURNIQUET SINGLE 44IN (TOURNIQUET CUFF) IMPLANT
DRAPE C-ARM 42X72 X-RAY (DRAPES) ×2 IMPLANT
DRAPE INCISE IOBAN 66X45 STRL (DRAPES) ×2 IMPLANT
DRAPE PROXIMA HALF (DRAPES) ×2 IMPLANT
DRAPE U-SHAPE 47X51 STRL (DRAPES) ×2 IMPLANT
DRSG PAD ABDOMINAL 8X10 ST (GAUZE/BANDAGES/DRESSINGS) IMPLANT
DURAPREP 26ML APPLICATOR (WOUND CARE) ×2 IMPLANT
ELECT REM PT RETURN 9FT ADLT (ELECTROSURGICAL) ×2
ELECTRODE REM PT RTRN 9FT ADLT (ELECTROSURGICAL) ×1 IMPLANT
GAUZE XEROFORM 5X9 LF (GAUZE/BANDAGES/DRESSINGS) IMPLANT
GLOVE BIOGEL PI IND STRL 7.5 (GLOVE) IMPLANT
GLOVE BIOGEL PI IND STRL 8 (GLOVE) ×1 IMPLANT
GLOVE BIOGEL PI INDICATOR 7.5 (GLOVE)
GLOVE BIOGEL PI INDICATOR 8 (GLOVE) ×1
GLOVE ECLIPSE 7.0 STRL STRAW (GLOVE) IMPLANT
GLOVE ORTHO TXT STRL SZ7.5 (GLOVE) ×2 IMPLANT
GOWN PREVENTION PLUS LG XLONG (DISPOSABLE) IMPLANT
GOWN PREVENTION PLUS XLARGE (GOWN DISPOSABLE) ×2 IMPLANT
GOWN STRL NON-REIN LRG LVL3 (GOWN DISPOSABLE) ×2 IMPLANT
K-WIRE ACE 1.6X6 (WIRE) ×2
KIT BASIN OR (CUSTOM PROCEDURE TRAY) ×2 IMPLANT
KIT ROOM TURNOVER OR (KITS) ×2 IMPLANT
KWIRE ACE 1.6X6 (WIRE) ×1 IMPLANT
MANIFOLD NEPTUNE II (INSTRUMENTS) IMPLANT
NS IRRIG 1000ML POUR BTL (IV SOLUTION) ×2 IMPLANT
PACK ORTHO EXTREMITY (CUSTOM PROCEDURE TRAY) ×2 IMPLANT
PAD ARMBOARD 7.5X6 YLW CONV (MISCELLANEOUS) ×4 IMPLANT
PAD CAST 4YDX4 CTTN HI CHSV (CAST SUPPLIES) IMPLANT
PADDING CAST COTTON 4X4 STRL (CAST SUPPLIES)
PADDING CAST COTTON 6X4 STRL (CAST SUPPLIES) IMPLANT
PLATE LOCK 6H 77 BILAT FIB (Plate) ×2 IMPLANT
SCREW CANC LAG 4X40 (Screw) ×4 IMPLANT
SCREW LOCK 3.5X10 DIST TIB (Screw) ×2 IMPLANT
SCREW LOCK 3.5X14 DIST TIB (Screw) ×4 IMPLANT
SCREW LOCK CORT STAR 3.5X10 (Screw) ×2 IMPLANT
SCREW LOCK CORT STAR 3.5X12 (Screw) ×4 IMPLANT
SCREW LOCK CORT STAR 3.5X14 (Screw) ×2 IMPLANT
SCREW NON LOCKING LP 3.5 14MM (Screw) ×2 IMPLANT
SPONGE GAUZE 4X4 12PLY (GAUZE/BANDAGES/DRESSINGS) IMPLANT
SPONGE LAP 18X18 X RAY DECT (DISPOSABLE) IMPLANT
STAPLER VISISTAT 35W (STAPLE) IMPLANT
SUCTION FRAZIER TIP 10 FR DISP (SUCTIONS) ×2 IMPLANT
SUT ETHILON 3 0 PS 1 (SUTURE) ×2 IMPLANT
SUT VIC AB 2-0 CT1 27 (SUTURE) ×1
SUT VIC AB 2-0 CT1 TAPERPNT 27 (SUTURE) ×1 IMPLANT
TOWEL OR 17X24 6PK STRL BLUE (TOWEL DISPOSABLE) ×2 IMPLANT
TOWEL OR 17X26 10 PK STRL BLUE (TOWEL DISPOSABLE) ×2 IMPLANT
TUBE CONNECTING 12X1/4 (SUCTIONS) ×2 IMPLANT
WATER STERILE IRR 1000ML POUR (IV SOLUTION) ×2 IMPLANT
YANKAUER SUCT BULB TIP NO VENT (SUCTIONS) IMPLANT

## 2012-07-19 NOTE — Interval H&P Note (Signed)
History and Physical Interval Note:  07/19/2012 4:07 PM  [name]  has presented today for surgery, with the diagnosis of Right bimalleolar ankle fracture  The various methods of treatment have been discussed with the patient and family. After consideration of risks, benefits and other options for treatment, the patient has consented to  Procedure(s) (LRB) with comments: OPEN REDUCTION INTERNAL FIXATION (ORIF) ANKLE FRACTURE (Right) - open reduction internal fixation right bimalleolar ankle fracture as a surgical intervention .  The patient's history has been reviewed, patient examined, no change in status, stable for surgery.  I have reviewed the patient's chart and labs.  Questions were answered to the patient's satisfaction.     [name]

## 2012-07-19 NOTE — Anesthesia Preprocedure Evaluation (Addendum)
Anesthesia Evaluation  Patient identified by MRN, date of birth, ID band Patient awake    Reviewed: Allergy & Precautions, H&P , NPO status , Patient's Chart, lab work & pertinent test results, reviewed documented beta blocker date and time   History of Anesthesia Complications (+) Emergence Delirium  Airway Mallampati: II TM Distance: >3 FB Neck ROM: full    Dental  (+) Caps, Teeth Intact and Dental Advisory Given   Pulmonary COPD breath sounds clear to auscultation        Cardiovascular hypertension, Pt. on medications Rhythm:regular     Neuro/Psych  Headaches, negative psych ROS   GI/Hepatic negative GI ROS, Neg liver ROS,   Endo/Other  Hypothyroidism   Renal/GU negative Renal ROS  negative genitourinary   Musculoskeletal   Abdominal   Peds  Hematology negative hematology ROS (+)   Anesthesia Other Findings See surgeon's H&P   Reproductive/Obstetrics negative OB ROS                          Anesthesia Physical Anesthesia Plan  ASA: II  Anesthesia Plan: General   Post-op Pain Management:    Induction: Intravenous  Airway Management Planned: LMA  Additional Equipment:   Intra-op Plan:   Post-operative Plan: Extubation in OR  Informed Consent: I have reviewed the patients History and Physical, chart, labs and discussed the procedure including the risks, benefits and alternatives for the proposed anesthesia with the patient or authorized representative who has indicated his/her understanding and acceptance.   Dental Advisory Given  Plan Discussed with: CRNA, Surgeon and Anesthesiologist  Anesthesia Plan Comments:        Anesthesia Quick Evaluation

## 2012-07-19 NOTE — Transfer of Care (Signed)
Immediate Anesthesia Transfer of Care Note  Patient: [name]  Procedure(s) Performed: Procedure(s) (LRB) with comments: OPEN REDUCTION INTERNAL FIXATION (ORIF) ANKLE FRACTURE (Right) - open reduction internal fixation right bimalleolar ankle fracture  Patient Location: PACU  Anesthesia Type: General  Level of Consciousness: awake, alert  and oriented  Airway & Oxygen Therapy: Patient Spontanous Breathing and Patient connected to nasal cannula oxygen  Post-op Assessment: Report given to PACU RN and Post -op Vital signs reviewed and stable  Post vital signs: Reviewed and stable  Complications: No apparent anesthesia complications

## 2012-07-19 NOTE — Brief Op Note (Signed)
07/19/2012  6:13 PM  PATIENT:  [name]  46 y.o. female  PRE-OPERATIVE DIAGNOSIS:  Right bimalleolar ankle fracture  POST-OPERATIVE DIAGNOSIS:  Right bimalleolar ankle fracture  PROCEDURE:  Procedure(s) (LRB) with comments: OPEN REDUCTION INTERNAL FIXATION (ORIF) ANKLE FRACTURE (Right) - open reduction internal fixation right bimalleolar ankle fracture biomet small frag plate and medial 40 mm lag screws SURGEON:  Surgeon(s) and Role:    * Eldred Manges, MD - Primary  PHYSICIAN ASSISTANT:   ASSISTANTS: none   ANESTHESIA:   local and general  EBL:  Total I/O In: 1000 [I.V.:1000] Out: -   BLOOD ADMINISTERED:none  DRAINS: none   LOCAL MEDICATIONS USED:  MARCAINE     SPECIMEN:  No Specimen  DISPOSITION OF SPECIMEN:  N/A  COUNTS:  YES  TOURNIQUET:   Total Tourniquet Time Documented: Thigh (Right) - 35 minutes  DICTATION: .Other Dictation: Dictation Number [id number]  PLAN OF CARE: Admit for overnight observation  PATIENT DISPOSITION:  PACU - hemodynamically stable.   Delay start of Pharmacological VTE agent (>24hrs) due to surgical blood loss or risk of bleeding: not applicable

## 2012-07-19 NOTE — Preoperative (Signed)
Beta Blockers   Reason not to administer Beta Blockers:Not Applicable 

## 2012-07-19 NOTE — Anesthesia Postprocedure Evaluation (Signed)
  Anesthesia Post-op Note  Patient: [name]  Procedure(s) Performed: Procedure(s) (LRB) with comments: OPEN REDUCTION INTERNAL FIXATION (ORIF) ANKLE FRACTURE (Right) - open reduction internal fixation right bimalleolar ankle fracture  Patient Location: PACU  Anesthesia Type: General  Level of Consciousness: awake, alert  and oriented  Airway and Oxygen Therapy: Patient Spontanous Breathing and Patient connected to nasal cannula oxygen  Post-op Pain: mild  Post-op Assessment: Post-op Vital signs reviewed, Patient's Cardiovascular Status Stable, Respiratory Function Stable, Patent Airway and No signs of Nausea or vomiting  Post-op Vital Signs: Reviewed and stable  Complications: No apparent anesthesia complications

## 2012-07-20 ENCOUNTER — Encounter (HOSPITAL_COMMUNITY): Payer: Self-pay | Admitting: Orthopaedic Surgery

## 2012-07-20 NOTE — Progress Notes (Signed)
Subjective: 1 Day Post-Op Procedure(s) (LRB): OPEN REDUCTION INTERNAL FIXATION (ORIF) ANKLE FRACTURE (Right) Patient reports pain as mild.    Objective: Vital signs in last 24 hours: Temp:  [97.2 F (36.2 C)-98.4 F (36.9 C)] 97.9 F (36.6 C) (10/22 0715) Pulse Rate:  [79-104] 88  (10/22 0715) Resp:  [11-20] 18  (10/22 0715) BP: (136-165)/(76-85) 145/82 mmHg (10/22 0715) SpO2:  [95 %-99 %] 99 % (10/22 0715)  Intake/Output from previous day: 10/21 0701 - 10/22 0700 In: 1600 [P.O.:100; I.V.:1500] Out: 10 [Blood:10] Intake/Output this shift:    No results found for this basename: HGB:5 in the last 72 hours No results found for this basename: WBC:2,RBC:2,HCT:2,PLT:2 in the last 72 hours  Basename 07/19/12 1344  NA 136  K 3.7  CL 99  CO2 22  BUN 9  CREATININE 0.45*  GLUCOSE 90  CALCIUM 9.4   No results found for this basename: LABPT:2,INR:2 in the last 72 hours  dressing intact.    Assessment/Plan: 1 Day Post-Op Procedure(s) (LRB): OPEN REDUCTION INTERNAL FIXATION (ORIF) ANKLE FRACTURE (Right) Up with therapy    D/c IV   Discharge this AM  [name] C 07/20/2012, 7:32 AM

## 2012-07-20 NOTE — Discharge Summary (Signed)
Physician Discharge Summary  Patient ID: CHONTE RICKE MRN: [id number] DOB/AGE: 1966-06-21 46 y.o.  Admit date: 07/19/2012 Discharge date: 07/20/2012  Admission Diagnoses:right bimalleolar fracture  Discharge Diagnoses: same Principal Problem:  *Fracture of ankle, bimalleolar, right, closed   Discharged Condition: good  Hospital Course: underwent surgery, reduction and fixation of unstable ankle fracture with satisfactory post op course.   Consults: None  Significant Diagnostic Studies:  Treatments: surgery: as above  Discharge Exam: Blood pressure 145/82, pulse 88, temperature 97.9 F (36.6 C), temperature source Oral, resp. rate 18, last menstrual period 06/18/2012, SpO2 99.00%. dressing dry, ambulatory with crutches.   Disposition: 01-Home or Self Care Office Dr Ophelia Charter one week    Medication List     As of 07/20/2012  7:34 AM    STOP taking these medications         HYDROcodone-acetaminophen 10-325 MG per tablet   Commonly known as: NORCO      TAKE these medications         aspirin-acetaminophen-caffeine 250-250-65 MG per tablet   Commonly known as: EXCEDRIN MIGRAINE   Take 1 tablet by mouth daily as needed. For pain      levothyroxine 150 MCG tablet   Commonly known as: SYNTHROID, LEVOTHROID   Take 150 mcg by mouth daily.      lisinopril 10 MG tablet   Commonly known as: PRINIVIL,ZESTRIL   Take 10 mg by mouth daily.      meloxicam 15 MG tablet   Commonly known as: MOBIC   Take 15 mg by mouth daily.      methocarbamol 500 MG tablet   Commonly known as: ROBAXIN   Take 1 tablet (500 mg total) by mouth 4 (four) times daily.      oxyCODONE-acetaminophen 5-325 MG per tablet   Commonly known as: PERCOCET/ROXICET   Take 2 tablets by mouth every 4 (four) hours as needed for pain.           Follow-up Information    Follow up with Eldred Manges, MD. Schedule an appointment as soon as possible for a visit in 2 weeks.   Contact information:   [address] [place] [address] [phone]          Signed: Eldred Manges 07/20/2012, 7:34 AM

## 2012-07-20 NOTE — Care Management Note (Signed)
    Page 1 of 1   07/20/2012     1:37:55 PM   CARE MANAGEMENT NOTE 07/20/2012  Patient:  [name]   Account Number:  [id number]  Date Initiated:  07/20/2012  Documentation initiated by:  Anette Guarneri  Subjective/Objective Assessment:   ORIF right ankle  has crutches     Action/Plan:   home with self care   Anticipated DC Date:  07/20/2012   Anticipated DC Plan:  HOME/SELF CARE      DC Planning Services  CM consult      PAC Choice  DURABLE MEDICAL EQUIPMENT   Choice offered to / List presented to:     DME arranged  Levan Hurst      DME agency  [facility]        Status of service:  Completed, signed off Medicare Important Message given?  NO (If response is "NO", the following Medicare IM given date fields will be blank) Date Medicare IM given:   Date Additional Medicare IM given:    Discharge Disposition:  HOME/SELF CARE  Per UR Regulation:  Reviewed for med. necessity/level of care/duration of stay  If discussed at Long Length of Stay Meetings, dates discussed:    Comments:  07/20/12  13:36 Anette Guarneri RN/CM per PT no f/u needs but does need RW, contacted [name] [facility] for RW to be delivered to room prior to d/c home.

## 2012-07-20 NOTE — Op Note (Signed)
[name], QAZI               ACCOUNT NO.:  [id number]  MEDICAL RECORD NO.:  [id number]  LOCATION:  [id number]                        FACILITY:  [facility]  PHYSICIAN:  Alleyne Lac C. Ophelia Charter, M.D.    DATE OF BIRTH:  1965/11/20  DATE OF PROCEDURE:  07/19/2012 DATE OF DISCHARGE:  07/20/2012                              OPERATIVE REPORT   PREOPERATIVE DIAGNOSIS:  Right bimalleolar ankle fracture.  POSTOPERATIVE DIAGNOSIS:  Right bimalleolar ankle fracture.  PROCEDURE:  Open reduction and internal fixation, right bimalleolar ankle fracture.  SURGEON:  Taejah Ohalloran C. Ophelia Charter, M.D.  ANESTHESIA:  General plus 7 mL Marcaine local.  TOURNIQUET TIME:  33 minutes.  This patient slipped on wet grass, suffered a bimalleolar ankle fracture dislocation, was reduced, placed in a splint.  She had fiberglass splint portion and fracture blisters medial and lateral.  She was placed in new splint and now returns this week for ORIF.  After standard prepping and draping, preoperative antibiotics, Ancef 2 g proximal thigh tourniquet was applied.  DuraPrep was used.  Time-out procedure was completed after stockinette, extremity sheets, and drapes were applied.  Sterile skin marker was used for C-shaped incision on the anterior aspect and front of the medial malleolus.  Lateral incision had been made as a gentle C anteriorly since there were fracture blisters directly over the lateral aspect of the fibula when I was making straight lateral incision over the fibula.  Medial incision was opened, joint was irrigated, fracture was inspected, hematoma was evacuated, posterior tib and deltoid ligament were not in the fracture site.  Joint was irrigated.  Lateral incision was made.  Subperiosteal dissection on the fibula.  Fibula was reduced, held with clamp for reduction and a Biomet locking small frag plate was applied with locking screws distal 3 and bicortical fixation proximally.  Initial 1st screw was nonlocking to suck  the plate down.  Attention was then turned medial and the fracture was reduced and held with a towel clamp, fixed with K-wire and then two 40-mm cancellous lag screws were placed, lagging the fracture site tightening it down securely and anatomically.  Final spot pictures were taken after irrigation with saline solution.  Standard closure 2-0 Vicryl, skin staple closure.  Xeroform over the incisions after Marcaine infiltration.  Xeroform over the blisters, which had been cut to relieve the fracture blister serous fluid.  4x4s, ABDs, Webril, short-leg splint was applied.  Plaster using 5 x 30 small Webril and then Ace wrap.  The procedure was as planned.     Jamal Pavon C. Ophelia Charter, M.D.     MCY/MEDQ  D:  07/19/2012  T:  07/20/2012  Job:  [id number]

## 2012-07-20 NOTE — Evaluation (Signed)
Physical Therapy Evaluation Patient Details Name: [name] MRN: [id number] DOB: October 09, 1965 Today's Date: 07/20/2012 Time: 1191-4782 PT Time Calculation (min): 40 min  PT Assessment / Plan / Recommendation Clinical Impression  46 yo female with ORIF of R ankle and NWB status. Although pt has been on crutches prior to surgery, she had some difficulty with shoulder pain and appeared to have limited distance of ambulation. She did better with the stability of RW and should  be better able to maintain NWB during functional activities at home.  Requested RW through case Production designer, theatre/television/film. Attempted knee scooter for patient to possilbly  use at work, but she was unsuccessful with this early attempt    PT Assessment  Patent does not need any further PT services    Follow Up Recommendations  No PT follow up    Does the patient have the potential to tolerate intense rehabilitation      Barriers to Discharge        Equipment Recommendations  Rolling walker with 5" wheels    Recommendations for Other Services     Frequency      Precautions / Restrictions Precautions Precautions: Fall Required Braces or Orthoses: Other Brace/Splint Other Brace/Splint: pt with short leg rigid dressing on right leg Restrictions Weight Bearing Restrictions: Yes RLE Weight Bearing: Non weight bearing   Pertinent Vitals/Pain Pain in right leg 5/10       Mobility  Bed Mobility Bed Mobility: Supine to Sit;Sit to Supine Supine to Sit: 7: Independent Sit to Supine: 7: Independent;HOB flat Transfers Transfers: Sit to Stand;Stand to Sit Sit to Stand: 6: Modified independent (Device/Increase time) Stand to Sit: 6: Modified independent (Device/Increase time) Ambulation/Gait Ambulation/Gait Assistance: 5: Supervision Ambulation Distance (Feet): 50 Feet (3 sitting rest breaks) Assistive device: Rolling walker;Crutches Ambulation/Gait Assistance Details: Crutches adjusted.   General Gait Details: pt reports  she has intermittent left shoulder pain when walking with crutches.  She has no pain when walking with walker Stairs: Yes Stairs Assistance: 3: Mod assist Stairs Assistance Details (indicate cue type and reason): pt has difficulty lifting body up onto stairs.  Husband helped her last week and will be able to help today to enter home.  She feels confident they can do this Stair Management Technique: With crutches;One rail Right Number of Stairs: 3  Wheelchair Mobility Wheelchair Mobility: No    Shoulder Instructions     Exercises Other Exercises Other Exercises: pt instructed in toe ROM for flex/ext and abd/add to activate muscle pump to control edema along with elevation   PT Diagnosis:    PT Problem List:   PT Treatment Interventions:     PT Goals    Visit Information  Last PT Received On: 07/20/12 Assistance Needed: +1    Subjective Data  Subjective: pt concerned about going back to work where she will have to be on her feet most of the time for 10 hour days Patient Stated Goal: to go home ASAP   Prior Functioning  Home Living Lives With: Spouse Available Help at Discharge: Family;Available 24 hours/day Type of Home: House Home Access: Stairs to enter [employer] of Steps: 3 Entrance Stairs-Rails: None Home Layout: One level Home Adaptive Equipment: Crutches Prior Function Level of Independence: Independent with assistive device(s) Able to Take Stairs?: Yes (with husband assist) Driving: Yes Vocation: Full time employment Communication Communication: No difficulties    Cognition  Overall Cognitive Status: Appears within functional limits for tasks assessed/performed Arousal/Alertness: Awake/alert Orientation Level: Appears intact for tasks  assessed Behavior During Session: [facility] for tasks performed    Extremity/Trunk Assessment Right Lower Extremity Assessment RLE ROM/Strength/Tone: Deficits RLE ROM/Strength/Tone Deficits: pt with rigid dressin on  RLE. some edema noted in foot, toes OK.  good refill RLE Sensation: WFL - Light Touch;WFL - Proprioception (pt reports intermittent numbness in 4th toe, none currently) RLE Coordination: WFL - gross/fine motor Left Lower Extremity Assessment LLE ROM/Strength/Tone: Within functional levels Trunk Assessment Trunk Assessment: Normal   Balance Balance Balance Assessed: No  End of Session PT - End of Session Equipment Utilized During Treatment: Gait belt Activity Tolerance: Patient limited by pain Patient left: in bed Nurse Communication: Mobility status  GP Functional Assessment Tool Used: clinical judgement Functional Limitation: Mobility: Walking and moving around Mobility: Walking and Moving Around Current Status ([id number]): At least 60 percent but less than 80 percent impaired, limited or restricted Mobility: Walking and Moving Around Goal Status [phone]): At least 60 percent but less than 80 percent impaired, limited or restricted Mobility: Walking and Moving Around Discharge Status [phone]): At least 60 percent but less than 80 percent impaired, limited or restricted   Rosey Bath K. Marlin, [place] [phone] 07/20/2012, 10:23 AM

## 2013-01-21 ENCOUNTER — Other Ambulatory Visit: Payer: Self-pay | Admitting: Obstetrics and Gynecology

## 2013-01-21 ENCOUNTER — Other Ambulatory Visit (HOSPITAL_COMMUNITY)
Admission: RE | Admit: 2013-01-21 | Discharge: 2013-01-21 | Disposition: A | Discharge status: Home / Self Care | Payer: BC Managed Care – PPO | Source: Ambulatory Visit | Attending: Obstetrics and Gynecology | Admitting: Obstetrics and Gynecology

## 2013-01-21 DIAGNOSIS — Z1151 Encounter for screening for human papillomavirus (HPV): Secondary | ICD-10-CM | POA: Insufficient documentation

## 2013-01-21 DIAGNOSIS — Z01419 Encounter for gynecological examination (general) (routine) without abnormal findings: Secondary | ICD-10-CM | POA: Insufficient documentation

## 2013-05-06 ENCOUNTER — Other Ambulatory Visit: Payer: Self-pay

## 2013-05-06 DIAGNOSIS — Z1231 Encounter for screening mammogram for malignant neoplasm of breast: Secondary | ICD-10-CM

## 2013-05-20 ENCOUNTER — Ambulatory Visit
Admission: RE | Admit: 2013-05-20 | Discharge: 2013-05-20 | Disposition: A | Discharge status: Home / Self Care | Payer: BC Managed Care – PPO | Source: Ambulatory Visit

## 2013-05-20 DIAGNOSIS — Z1231 Encounter for screening mammogram for malignant neoplasm of breast: Secondary | ICD-10-CM

## 2013-12-31 ENCOUNTER — Encounter: Payer: Self-pay | Admitting: *Deleted

## 2014-04-03 ENCOUNTER — Other Ambulatory Visit: Payer: Self-pay | Admitting: Physician Assistant

## 2014-04-03 ENCOUNTER — Ambulatory Visit
Admission: RE | Admit: 2014-04-03 | Discharge: 2014-04-03 | Disposition: A | Discharge status: Home / Self Care | Payer: BC Managed Care – PPO | Source: Ambulatory Visit | Attending: Physician Assistant | Admitting: Physician Assistant

## 2014-04-03 DIAGNOSIS — M25571 Pain in right ankle and joints of right foot: Secondary | ICD-10-CM

## 2014-05-04 ENCOUNTER — Other Ambulatory Visit: Payer: Self-pay

## 2014-05-04 DIAGNOSIS — Z1231 Encounter for screening mammogram for malignant neoplasm of breast: Secondary | ICD-10-CM

## 2014-05-22 ENCOUNTER — Ambulatory Visit
Admission: RE | Admit: 2014-05-22 | Discharge: 2014-05-22 | Disposition: A | Discharge status: Home / Self Care | Payer: BC Managed Care – PPO | Source: Ambulatory Visit

## 2014-05-22 DIAGNOSIS — Z1231 Encounter for screening mammogram for malignant neoplasm of breast: Secondary | ICD-10-CM

## 2014-07-17 ENCOUNTER — Other Ambulatory Visit: Payer: Self-pay | Admitting: Obstetrics and Gynecology

## 2014-10-02 ENCOUNTER — Other Ambulatory Visit: Payer: Self-pay | Admitting: Physician Assistant

## 2014-10-02 ENCOUNTER — Ambulatory Visit
Admission: RE | Admit: 2014-10-02 | Discharge: 2014-10-02 | Disposition: A | Discharge status: Home / Self Care | Payer: Self-pay | Source: Ambulatory Visit | Attending: Physician Assistant | Admitting: Physician Assistant

## 2014-10-02 DIAGNOSIS — R52 Pain, unspecified: Secondary | ICD-10-CM

## 2015-03-09 ENCOUNTER — Other Ambulatory Visit: Payer: Self-pay | Admitting: Obstetrics and Gynecology

## 2015-03-09 ENCOUNTER — Other Ambulatory Visit (HOSPITAL_COMMUNITY)
Admission: RE | Admit: 2015-03-09 | Discharge: 2015-03-09 | Disposition: A | Discharge status: Home / Self Care | Payer: BLUE CROSS/BLUE SHIELD | Source: Ambulatory Visit | Attending: Obstetrics and Gynecology | Admitting: Obstetrics and Gynecology

## 2015-03-09 DIAGNOSIS — Z01419 Encounter for gynecological examination (general) (routine) without abnormal findings: Secondary | ICD-10-CM | POA: Insufficient documentation

## 2015-03-12 LAB — CYTOLOGY - PAP

## 2015-06-11 ENCOUNTER — Other Ambulatory Visit: Payer: Self-pay

## 2015-06-11 DIAGNOSIS — Z1231 Encounter for screening mammogram for malignant neoplasm of breast: Secondary | ICD-10-CM

## 2015-06-22 ENCOUNTER — Ambulatory Visit
Admission: RE | Admit: 2015-06-22 | Discharge: 2015-06-22 | Disposition: A | Discharge status: Home / Self Care | Payer: BLUE CROSS/BLUE SHIELD | Source: Ambulatory Visit

## 2015-06-22 DIAGNOSIS — Z1231 Encounter for screening mammogram for malignant neoplasm of breast: Secondary | ICD-10-CM

## 2016-03-03 DIAGNOSIS — E7800 Pure hypercholesterolemia, unspecified: Secondary | ICD-10-CM | POA: Diagnosis not present

## 2016-03-03 DIAGNOSIS — Z79899 Other long term (current) drug therapy: Secondary | ICD-10-CM | POA: Diagnosis not present

## 2016-03-07 ENCOUNTER — Other Ambulatory Visit (HOSPITAL_COMMUNITY)
Admission: RE | Admit: 2016-03-07 | Discharge: 2016-03-07 | Disposition: A | Discharge status: Home / Self Care | Payer: BLUE CROSS/BLUE SHIELD | Source: Ambulatory Visit | Attending: Obstetrics and Gynecology | Admitting: Obstetrics and Gynecology

## 2016-03-07 ENCOUNTER — Other Ambulatory Visit: Payer: Self-pay | Admitting: Obstetrics and Gynecology

## 2016-03-07 DIAGNOSIS — Z1151 Encounter for screening for human papillomavirus (HPV): Secondary | ICD-10-CM | POA: Diagnosis not present

## 2016-03-07 DIAGNOSIS — Z01419 Encounter for gynecological examination (general) (routine) without abnormal findings: Secondary | ICD-10-CM | POA: Diagnosis not present

## 2016-03-10 LAB — CYTOLOGY - PAP

## 2016-04-30 DIAGNOSIS — E7800 Pure hypercholesterolemia, unspecified: Secondary | ICD-10-CM | POA: Diagnosis not present

## 2016-04-30 DIAGNOSIS — Z79899 Other long term (current) drug therapy: Secondary | ICD-10-CM | POA: Diagnosis not present

## 2016-06-25 DIAGNOSIS — Z23 Encounter for immunization: Secondary | ICD-10-CM | POA: Diagnosis not present

## 2016-07-08 ENCOUNTER — Other Ambulatory Visit: Payer: Self-pay | Admitting: Family Medicine

## 2016-07-08 DIAGNOSIS — Z1231 Encounter for screening mammogram for malignant neoplasm of breast: Secondary | ICD-10-CM

## 2016-07-11 ENCOUNTER — Ambulatory Visit
Admission: RE | Admit: 2016-07-11 | Discharge: 2016-07-11 | Disposition: A | Discharge status: Home / Self Care | Payer: BLUE CROSS/BLUE SHIELD | Source: Ambulatory Visit | Attending: Family Medicine | Admitting: Family Medicine

## 2016-07-11 DIAGNOSIS — Z1231 Encounter for screening mammogram for malignant neoplasm of breast: Secondary | ICD-10-CM

## 2016-09-16 ENCOUNTER — Emergency Department (HOSPITAL_COMMUNITY)
Admission: EM | Admit: 2016-09-16 | Discharge: 2016-09-16 | Disposition: A | Discharge status: Home / Self Care | Payer: BLUE CROSS/BLUE SHIELD | Attending: Emergency Medicine | Admitting: Emergency Medicine

## 2016-09-16 ENCOUNTER — Encounter (HOSPITAL_COMMUNITY): Payer: Self-pay | Admitting: Emergency Medicine

## 2016-09-16 DIAGNOSIS — J449 Chronic obstructive pulmonary disease, unspecified: Secondary | ICD-10-CM | POA: Diagnosis not present

## 2016-09-16 DIAGNOSIS — R109 Unspecified abdominal pain: Secondary | ICD-10-CM

## 2016-09-16 DIAGNOSIS — E039 Hypothyroidism, unspecified: Secondary | ICD-10-CM | POA: Insufficient documentation

## 2016-09-16 DIAGNOSIS — Z7982 Long term (current) use of aspirin: Secondary | ICD-10-CM | POA: Insufficient documentation

## 2016-09-16 DIAGNOSIS — R1011 Right upper quadrant pain: Secondary | ICD-10-CM | POA: Diagnosis not present

## 2016-09-16 DIAGNOSIS — F17210 Nicotine dependence, cigarettes, uncomplicated: Secondary | ICD-10-CM | POA: Diagnosis not present

## 2016-09-16 DIAGNOSIS — I10 Essential (primary) hypertension: Secondary | ICD-10-CM | POA: Insufficient documentation

## 2016-09-16 LAB — CBC WITH DIFFERENTIAL/PLATELET
BASOS PCT: 0 %
Basophils Absolute: 0 10*3/uL (ref 0.0–0.1)
EOS ABS: 0.1 10*3/uL (ref 0.0–0.7)
Eosinophils Relative: 0 %
HEMATOCRIT: 41.2 % (ref 36.0–46.0)
Hemoglobin: 14.2 g/dL (ref 12.0–15.0)
Lymphocytes Relative: 14 %
Lymphs Abs: 1.7 10*3/uL (ref 0.7–4.0)
MCH: 31.2 pg (ref 26.0–34.0)
MCHC: 34.5 g/dL (ref 30.0–36.0)
MCV: 90.5 fL (ref 78.0–100.0)
MONO ABS: 0.7 10*3/uL (ref 0.1–1.0)
MONOS PCT: 6 %
Neutro Abs: 10.2 10*3/uL — ABNORMAL HIGH (ref 1.7–7.7)
Neutrophils Relative %: 80 %
Platelets: 304 10*3/uL (ref 150–400)
RBC: 4.55 MIL/uL (ref 3.87–5.11)
RDW: 13.5 % (ref 11.5–15.5)
WBC: 12.7 10*3/uL — ABNORMAL HIGH (ref 4.0–10.5)

## 2016-09-16 LAB — URINALYSIS, ROUTINE W REFLEX MICROSCOPIC
Glucose, UA: NEGATIVE mg/dL
HGB URINE DIPSTICK: NEGATIVE
Ketones, ur: NEGATIVE mg/dL
LEUKOCYTES UA: NEGATIVE
Nitrite: NEGATIVE
Protein, ur: 30 mg/dL — AB
SPECIFIC GRAVITY, URINE: 1.021 (ref 1.005–1.030)
pH: 5 (ref 5.0–8.0)

## 2016-09-16 LAB — COMPREHENSIVE METABOLIC PANEL
ALBUMIN: 4.5 g/dL (ref 3.5–5.0)
ALT: 49 U/L (ref 14–54)
ANION GAP: 12 (ref 5–15)
AST: 45 U/L — ABNORMAL HIGH (ref 15–41)
Alkaline Phosphatase: 102 U/L (ref 38–126)
BILIRUBIN TOTAL: 0.9 mg/dL (ref 0.3–1.2)
BUN: 10 mg/dL (ref 6–20)
CO2: 23 mmol/L (ref 22–32)
Calcium: 9.6 mg/dL (ref 8.9–10.3)
Chloride: 99 mmol/L — ABNORMAL LOW (ref 101–111)
Creatinine, Ser: 0.72 mg/dL (ref 0.44–1.00)
GFR calc non Af Amer: >60 mL/min (ref >60)
GLUCOSE: 141 mg/dL — AB (ref 65–99)
POTASSIUM: 3.7 mmol/L (ref 3.5–5.1)
SODIUM: 134 mmol/L — AB (ref 135–145)
TOTAL PROTEIN: 7.1 g/dL (ref 6.5–8.1)

## 2016-09-16 LAB — LIPASE, BLOOD: LIPASE: 11 U/L (ref 11–51)

## 2016-09-16 MED ORDER — SODIUM CHLORIDE 0.9 % IV BOLUS (SEPSIS)
1000.0000 mL | Freq: Once | INTRAVENOUS | Status: AC
  Administered 2016-09-16: 1000 mL via INTRAVENOUS

## 2016-09-16 MED ORDER — HYDROCODONE-ACETAMINOPHEN 5-325 MG PO TABS: 1.0000 | ORAL_TABLET | ORAL | 0 refills | Status: DC | PRN

## 2016-09-16 MED ORDER — HYDROMORPHONE HCL 2 MG/ML IJ SOLN
0.5000 mg | Freq: Once | INTRAMUSCULAR | Status: AC
Start: 2016-09-16 — End: 2016-09-16
  Administered 2016-09-16: 0.5 mg via INTRAVENOUS
  Filled 2016-09-16: qty 1

## 2016-09-16 MED ORDER — MORPHINE SULFATE (PF) 4 MG/ML IV SOLN: 4.0000 mg | Freq: Once | INTRAVENOUS | Status: DC

## 2016-09-16 MED ORDER — ONDANSETRON HCL 4 MG/2ML IJ SOLN
4.0000 mg | Freq: Once | INTRAMUSCULAR | Status: AC
  Administered 2016-09-16: 4 mg via INTRAVENOUS
  Filled 2016-09-16: qty 2

## 2016-09-16 MED ORDER — ONDANSETRON HCL 4 MG PO TABS: 4.0000 mg | ORAL_TABLET | Freq: Four times a day (QID) | ORAL | 0 refills | Status: DC

## 2016-09-16 NOTE — ED Provider Notes (Signed)
[facility]-EMERGENCY DEPT Provider Note   CSN: [id number] Arrival date & time: 09/16/16  0556     History   Chief Complaint Chief Complaint  Patient presents with  . Back Pain  . Nausea    HPI [name] is a 50 y.o. female who presents with right flank pain. PMH significant for hx of back pain, COPD, current tobacco use, HTN, hypothyroidism. Past surgical hx significant for cholecystectomy, back surgery. She states that 2 days ago she developed fever, chills, RUQ pain, and nausea with vomiting. This resolved within 24 hours and then yesterday she developed some right sided flank pain. She went to sleep last night and was woken up by the pain. She took a hot shower to relieve the pain and tried to go to work this morning however the pain became severe. It is sharp, stabbing, constant, non-radiating. She endorses current nausea as well. Denies fever, chills, chest pain, SOB, abdominal pain, diarrhea, urinary symptoms, vaginal symptoms. She states this pain is different from her back pain.  HPI  Past Medical History:  Diagnosis Date  . Anemia   . Arthritis   . Complication of anesthesia    "wakes up fighting"  . COPD (chronic obstructive pulmonary disease) (HCC)   . Headache(784.0)    long time ago  . History of blood transfusion    after tonsils removed  . Hypertension   . Hypothyroidism   . Thyroid disease     Patient Active Problem List   Diagnosis Date Noted  . Fracture of ankle, bimalleolar, right, closed 07/19/2012    Class: Acute    Past Surgical History:  Procedure Laterality Date  . APPENDECTOMY    . BACK SURGERY     Laminectomy  . CHOLECYSTECTOMY    . ORIF ANKLE FRACTURE  07/19/2012   Procedure: OPEN REDUCTION INTERNAL FIXATION (ORIF) ANKLE FRACTURE;  Surgeon: Eldred MangesMark C Yates, MD;  Location: [facility] OR;  Service: Orthopedics;  Laterality: Right;  open reduction internal fixation right bimalleolar ankle fracture  . TONSILLECTOMY    . TYMPANOSTOMY TUBE PLACEMENT      . WISDOM TOOTH EXTRACTION      OB History    No data available       Home Medications    Prior to Admission medications   Medication Sig Start Date End Date Taking? Authorizing Provider  aspirin-acetaminophen-caffeine (EXCEDRIN MIGRAINE) [phone] MG per tablet Take 1 tablet by mouth daily as needed. For pain    Historical Provider, MD  levothyroxine (SYNTHROID, LEVOTHROID) 150 MCG tablet Take 150 mcg by mouth daily.    Historical Provider, MD  lisinopril (PRINIVIL,ZESTRIL) 10 MG tablet Take 10 mg by mouth daily.    Historical Provider, MD  meloxicam (MOBIC) 15 MG tablet Take 15 mg by mouth daily.    Historical Provider, MD  methocarbamol (ROBAXIN) 500 MG tablet Take 1 tablet (500 mg total) by mouth 4 (four) times daily. 07/19/12   Maud DeedSheila Vernon, PA-C  oxyCODONE-acetaminophen (ROXICET) 5-325 MG per tablet Take 2 tablets by mouth every 4 (four) hours as needed for pain. 07/19/12   Maud DeedSheila Vernon, PA-C    Family History No family history on file.  Social History Social History  Substance Use Topics  . Smoking status: Current Some Day Smoker    Packs/day: 1.00    Years: 20.00    Types: Cigarettes  . Smokeless tobacco: Never Used  . Alcohol use 1.5 oz/week    3 Standard drinks or equivalent per week  Allergies   Patient has no known allergies.   Review of Systems Review of Systems  Constitutional: Positive for chills (resolved) and fever (resolved).  Respiratory: Negative for shortness of breath.   Cardiovascular: Negative for chest pain.  Gastrointestinal: Positive for abdominal pain, nausea and vomiting (resolved). Negative for diarrhea.  Genitourinary: Positive for flank pain. Negative for dysuria, frequency, urgency, vaginal bleeding and vaginal discharge.  Musculoskeletal: Negative for back pain.  All other systems reviewed and are negative.    Physical Exam Updated Vital Signs BP (!) 118/103 (BP Location: Right Arm)   Pulse 91   Temp 98.2 F (36.8 C)  (Oral)   Resp 18   Ht 5\' 3"  (1.6 m)   Wt 74.8 kg   LMP 05/03/2013   SpO2 99%   BMI 29.23 kg/m   Physical Exam  Constitutional: She is oriented to person, place, and time. She appears well-developed and well-nourished. No distress.  HENT:  Head: Normocephalic and atraumatic.  Eyes: Conjunctivae are normal. Pupils are equal, round, and reactive to light. Right eye exhibits no discharge. Left eye exhibits no discharge. No scleral icterus.  Neck: Normal range of motion.  Cardiovascular: Normal rate and regular rhythm.  Exam reveals no gallop and no friction rub.   No murmur heard. Pulmonary/Chest: Effort normal. No respiratory distress. She has wheezes. She has no rales. She exhibits no tenderness.  Mild, scattered wheezes  Abdominal: Soft. Bowel sounds are normal. She exhibits no distension and no mass. There is tenderness. There is no rebound and no guarding. No hernia.  R CVA tenderness  Neurological: She is alert and oriented to person, place, and time.  Skin: Skin is warm and dry.  Psychiatric: She has a normal mood and affect. Her behavior is normal.  Nursing note and vitals reviewed.    ED Treatments / Results  Labs (all labs ordered are listed, but only abnormal results are displayed) Labs Reviewed  CBC WITH DIFFERENTIAL/PLATELET - Abnormal; Notable for the following:       Result Value   WBC 12.7 (*)    Neutro Abs 10.2 (*)    All other components within normal limits  COMPREHENSIVE METABOLIC PANEL - Abnormal; Notable for the following:    Sodium 134 (*)    Chloride 99 (*)    Glucose, Bld 141 (*)    AST 45 (*)    All other components within normal limits  URINALYSIS, ROUTINE W REFLEX MICROSCOPIC - Abnormal; Notable for the following:    APPearance HAZY (*)    Bilirubin Urine MODERATE (*)    Protein, ur 30 (*)    Bacteria, UA RARE (*)    Squamous Epithelial / LPF 0-5 (*)    All other components within normal limits  LIPASE, BLOOD    EKG  EKG  Interpretation None       Radiology No results found.  Procedures Procedures (including critical care time)  Medications Ordered in ED Medications  sodium chloride 0.9 % bolus 1,000 mL (0 mLs Intravenous Stopped 09/16/16 0901)  ondansetron (ZOFRAN) injection 4 mg (4 mg Intravenous Given 09/16/16 0716)  HYDROmorphone (DILAUDID) injection 0.5 mg (0.5 mg Intravenous Given 09/16/16 0745)     Initial Impression / Assessment and Plan / ED Course  I have reviewed the triage vital signs and the nursing notes.  Pertinent labs & imaging results that were available during my care of the patient were reviewed by me and considered in my medical decision making (see chart for details).  Clinical Course    50 year old female with right flank pain. Possibly a stone. Patient is afebrile, not tachycardic or tachypneic, normotensive, and not hypoxic. CBC remarkable for leukocytosis of 12.7. CMP remarkable for mild hyponatremia and hypocholoremia. Glucose is 141. SCr is WNL. Lipase normal. UA is clean.   Discussed further work up with imaging vs treating pain as kidney stone since pain is controlled and she is tolerating PO. Patient would like to try pain medicine and antiemetics as outpatient. Rx give. Patient is NAD, non-toxic, with stable VS. Patient is informed of clinical course, understands medical decision making process, and agrees with plan. Opportunity for questions provided and all questions answered. Return precautions given.   Final Clinical Impressions(s) / ED Diagnoses   Final diagnoses:  Right flank pain    New Prescriptions Discharge Medication List as of 09/16/2016  9:05 AM    START taking these medications   Details  HYDROcodone-acetaminophen (NORCO/VICODIN) 5-325 MG tablet Take 1-2 tablets by mouth every 4 (four) hours as needed., Starting Tue 09/16/2016, Print    ondansetron (ZOFRAN) 4 MG tablet Take 1 tablet (4 mg total) by mouth every 6 (six) hours., Starting Tue  09/16/2016, Print         Bethel Born, PA-C 09/19/16 1418    Derwood Kaplan, MD 09/26/16 [phone]

## 2016-09-16 NOTE — ED Triage Notes (Signed)
Pt reports back pain that started this morning with nausea related to the back pain. Pt was sick on Sunday with sx of n&v x2 and low grade fever, pain was in the RUQ of the stomach. Pt reports constipation last two days with last BM Sunday morning. No injury to self.

## 2016-09-16 NOTE — Discharge Instructions (Signed)
Drink plenty of fluids Take narcotic pain medicine as needed. Do not drink alcohol or drive while taking this medicine Take anti-inflammatory drug for pain as well Take Zofran for nausea Return if symptoms are worsening

## 2016-09-17 ENCOUNTER — Ambulatory Visit
Admission: RE | Admit: 2016-09-17 | Discharge: 2016-09-17 | Disposition: A | Discharge status: Home / Self Care | Payer: BLUE CROSS/BLUE SHIELD | Source: Ambulatory Visit | Attending: Family Medicine | Admitting: Family Medicine

## 2016-09-17 ENCOUNTER — Other Ambulatory Visit: Payer: Self-pay | Admitting: Family Medicine

## 2016-09-17 DIAGNOSIS — K5730 Diverticulosis of large intestine without perforation or abscess without bleeding: Secondary | ICD-10-CM | POA: Diagnosis not present

## 2016-09-17 DIAGNOSIS — R109 Unspecified abdominal pain: Secondary | ICD-10-CM

## 2016-11-07 DIAGNOSIS — E7800 Pure hypercholesterolemia, unspecified: Secondary | ICD-10-CM | POA: Diagnosis not present

## 2016-11-07 DIAGNOSIS — J449 Chronic obstructive pulmonary disease, unspecified: Secondary | ICD-10-CM | POA: Diagnosis not present

## 2016-11-07 DIAGNOSIS — E039 Hypothyroidism, unspecified: Secondary | ICD-10-CM | POA: Diagnosis not present

## 2016-11-07 DIAGNOSIS — I10 Essential (primary) hypertension: Secondary | ICD-10-CM | POA: Diagnosis not present

## 2016-11-14 DIAGNOSIS — R7301 Impaired fasting glucose: Secondary | ICD-10-CM | POA: Diagnosis not present

## 2017-06-25 DIAGNOSIS — Z23 Encounter for immunization: Secondary | ICD-10-CM | POA: Diagnosis not present

## 2017-07-22 ENCOUNTER — Other Ambulatory Visit: Payer: Self-pay | Admitting: Family Medicine

## 2017-07-22 DIAGNOSIS — Z139 Encounter for screening, unspecified: Secondary | ICD-10-CM

## 2017-07-24 ENCOUNTER — Ambulatory Visit
Admission: RE | Admit: 2017-07-24 | Discharge: 2017-07-24 | Disposition: A | Discharge status: Home / Self Care | Payer: BLUE CROSS/BLUE SHIELD | Source: Ambulatory Visit | Attending: Family Medicine | Admitting: Family Medicine

## 2017-07-24 DIAGNOSIS — Z1231 Encounter for screening mammogram for malignant neoplasm of breast: Secondary | ICD-10-CM | POA: Diagnosis not present

## 2017-07-24 DIAGNOSIS — Z139 Encounter for screening, unspecified: Secondary | ICD-10-CM

## 2017-07-27 ENCOUNTER — Other Ambulatory Visit: Payer: Self-pay | Admitting: Family Medicine

## 2017-07-27 DIAGNOSIS — R928 Other abnormal and inconclusive findings on diagnostic imaging of breast: Secondary | ICD-10-CM

## 2017-07-31 ENCOUNTER — Other Ambulatory Visit: Payer: Self-pay | Admitting: Family Medicine

## 2017-07-31 ENCOUNTER — Ambulatory Visit
Admission: RE | Admit: 2017-07-31 | Discharge: 2017-07-31 | Disposition: A | Discharge status: Home / Self Care | Payer: BLUE CROSS/BLUE SHIELD | Source: Ambulatory Visit | Attending: Family Medicine | Admitting: Family Medicine

## 2017-07-31 DIAGNOSIS — R928 Other abnormal and inconclusive findings on diagnostic imaging of breast: Secondary | ICD-10-CM

## 2017-07-31 DIAGNOSIS — N6320 Unspecified lump in the left breast, unspecified quadrant: Secondary | ICD-10-CM

## 2017-07-31 DIAGNOSIS — N6489 Other specified disorders of breast: Secondary | ICD-10-CM | POA: Diagnosis not present

## 2017-07-31 DIAGNOSIS — R922 Inconclusive mammogram: Secondary | ICD-10-CM | POA: Diagnosis not present

## 2017-08-04 ENCOUNTER — Ambulatory Visit
Admission: RE | Admit: 2017-08-04 | Discharge: 2017-08-04 | Disposition: A | Discharge status: Home / Self Care | Payer: BLUE CROSS/BLUE SHIELD | Source: Ambulatory Visit | Attending: Family Medicine | Admitting: Family Medicine

## 2017-08-04 DIAGNOSIS — N6323 Unspecified lump in the left breast, lower outer quadrant: Secondary | ICD-10-CM | POA: Diagnosis not present

## 2017-08-04 DIAGNOSIS — N6032 Fibrosclerosis of left breast: Secondary | ICD-10-CM | POA: Diagnosis not present

## 2017-08-04 DIAGNOSIS — N6320 Unspecified lump in the left breast, unspecified quadrant: Secondary | ICD-10-CM

## 2017-08-05 ENCOUNTER — Other Ambulatory Visit: Payer: Self-pay | Admitting: Obstetrics and Gynecology

## 2017-08-05 ENCOUNTER — Other Ambulatory Visit (HOSPITAL_COMMUNITY)
Admission: RE | Admit: 2017-08-05 | Discharge: 2017-08-05 | Disposition: A | Discharge status: Home / Self Care | Payer: BLUE CROSS/BLUE SHIELD | Source: Ambulatory Visit | Attending: Obstetrics and Gynecology | Admitting: Obstetrics and Gynecology

## 2017-08-05 DIAGNOSIS — Z124 Encounter for screening for malignant neoplasm of cervix: Secondary | ICD-10-CM | POA: Insufficient documentation

## 2017-08-05 DIAGNOSIS — Z01419 Encounter for gynecological examination (general) (routine) without abnormal findings: Secondary | ICD-10-CM | POA: Diagnosis not present

## 2017-08-11 LAB — CYTOLOGY - PAP
DIAGNOSIS: NEGATIVE
HPV: NOT DETECTED

## 2018-01-06 DIAGNOSIS — Z0000 Encounter for general adult medical examination without abnormal findings: Secondary | ICD-10-CM | POA: Diagnosis not present

## 2018-01-06 DIAGNOSIS — E7800 Pure hypercholesterolemia, unspecified: Secondary | ICD-10-CM | POA: Diagnosis not present

## 2018-01-06 DIAGNOSIS — I10 Essential (primary) hypertension: Secondary | ICD-10-CM | POA: Diagnosis not present

## 2018-01-06 DIAGNOSIS — E039 Hypothyroidism, unspecified: Secondary | ICD-10-CM | POA: Diagnosis not present

## 2018-02-23 ENCOUNTER — Ambulatory Visit (INDEPENDENT_AMBULATORY_CARE_PROVIDER_SITE_OTHER): Payer: BLUE CROSS/BLUE SHIELD | Admitting: Internal Medicine

## 2018-02-23 VITALS — BP 120/83 | HR 106 | Ht 63.0 in | Wt 177.8 lb

## 2018-02-23 DIAGNOSIS — I10 Essential (primary) hypertension: Secondary | ICD-10-CM | POA: Diagnosis not present

## 2018-02-23 DIAGNOSIS — E7849 Other hyperlipidemia: Secondary | ICD-10-CM | POA: Diagnosis not present

## 2018-02-23 DIAGNOSIS — Z789 Other specified health status: Secondary | ICD-10-CM

## 2018-02-23 NOTE — Progress Notes (Addendum)
OFFICE CONSULT NOTE  Chief Complaint:  Possible FH  Primary Care Physician: Joanna Heys, MD  HPI:  [name] is a 52 y.o. female who is being seen today for the evaluation of possible FH at the request of Joanna Heys, MD.  This is a pleasant 52 year old female with a long-standing history of elevated cholesterol.  There is a strong family history of dyslipidemia with her father who died of an MI at age 74.  Her mom also had elevated cholesterol.  She has a brother that she does not speak much with and 3 children, 2 daughters and 1 son all in their 50s who she does not believe have had cholesterol testing.  She has no known coronary disease, but has been on multiple cholesterol medications in the past including rosuvastatin, atorvastatin, simvastatin, pravastatin and ezetimibe.  All of which caused myalgias.  Recent lab work from April 2019 showed total cholesterol 340, HDL 60, LDL 258 and triglycerides 604.  Hemoglobin A1c of 6.4 suggestive of impaired fasting glucose.  Pressure has been well controlled.  Unfortunately she is also a smoker with a 20-pack-year smoking history and does have some mild COPD as well as hypothyroidism on levothyroxine and mild hypertension on lisinopril.  PMHx:  Past Medical History:  Diagnosis Date  . Anemia   . Arthritis   . Complication of anesthesia    "wakes up fighting"  . COPD (chronic obstructive pulmonary disease) (HCC)   . Headache(784.0)    long time ago  . History of blood transfusion    after tonsils removed  . Hypertension   . Hypothyroidism   . Thyroid disease     Past Surgical History:  Procedure Laterality Date  . APPENDECTOMY    . BACK SURGERY     Laminectomy  . CHOLECYSTECTOMY    . ORIF ANKLE FRACTURE  07/19/2012   Procedure: OPEN REDUCTION INTERNAL FIXATION (ORIF) ANKLE FRACTURE;  Surgeon: Eldred Manges, MD;  Location: [facility] OR;  Service: Orthopedics;  Laterality: Right;  open reduction internal fixation right  bimalleolar ankle fracture  . TONSILLECTOMY    . TYMPANOSTOMY TUBE PLACEMENT    . WISDOM TOOTH EXTRACTION      FAMHx:  Family History  Problem Relation Age of Onset  . Breast cancer Paternal Aunt     SOCHx:   reports that she has been smoking cigarettes.  She has a 20.00 pack-year smoking history. She has never used smokeless tobacco. She reports that she drinks about 1.5 oz of alcohol per week. She reports that she does not use drugs.  ALLERGIES:  No Known Allergies  ROS: Pertinent items noted in HPI and remainder of comprehensive ROS otherwise negative.  HOME MEDS: Current Outpatient Medications on File Prior to Visit  Medication Sig Dispense Refill  . Ascorbic Acid (VITAMIN C) 100 MG tablet Take 100 mg by mouth daily.    Marland Kitchen CALCIUM PO Take 1 tablet by mouth daily as needed (supplement).    Marland Kitchen etodolac (LODINE) 400 MG tablet Take 400 mg by mouth 2 (two) times daily.  0  . Evolocumab (REPATHA SURECLICK) 140 MG/ML SOAJ Inject into the skin every 14 (fourteen) days.    . fluticasone (FLONASE) 50 MCG/ACT nasal spray Place 1 spray into both nostrils daily as needed for allergies or rhinitis.    Marland Kitchen levothyroxine (SYNTHROID, LEVOTHROID) 125 MCG tablet Take 125 mcg by mouth daily.  3  . lisinopril (PRINIVIL,ZESTRIL) 10 MG tablet Take 10 mg by mouth daily.  No current facility-administered medications on file prior to visit.     LABS/IMAGING: No results found for this or any previous visit (from the past 48 hour(s)). No results found.  LIPID PANEL: No results found for: CHOL, TRIG, HDL, CHOLHDL, VLDL, LDLCALC, LDLDIRECT  WEIGHTS: Wt Readings from Last 3 Encounters:  02/23/18 177 lb 12.8 oz (80.6 kg)  09/16/16 165 lb (74.8 kg)  07/16/12 162 lb (73.5 kg)    VITALS: BP 120/83   Pulse (!) 106   Ht  (1.6 m)   Wt 177 lb 12.8 oz (80.6 kg)   LMP 05/03/2013   SpO2 96%   BMI 31.50 kg/m   EXAM: General appearance: alert and no distress Neck: no carotid bruit, no JVD  and thyroid not enlarged, symmetric, no tenderness/mass/nodules Lungs: clear to auscultation bilaterally Heart: regular rate and rhythm Abdomen: soft, non-tender; bowel sounds normal; no masses,  no organomegaly Extremities: extremities normal, atraumatic, no cyanosis or edema Pulses: 2+ and symmetric Skin: Skin color, texture, turgor normal. No rashes or lesions Neurologic: Grossly normal : Pleasant  EKG: Deferred  ASSESSMENT: 1. Probable familial hyperlipidemia -Dutch score of 6 / Simon-Broome criteria suggests probable FH 2. Hypertension 3. Tobacco abuse 4. Hypothyroidism 5. Family history of premature coronary disease 6. Statin intolerance  PLAN: 1.   [name] has probable familial hyperlipidemia.  She has an elevated [place] lipid score with a family history of premature coronary disease, hypertension and tobacco abuse.  She will need significant lifestyle modification as well as aggressive lipid therapy.  Unfortunately she is statin intolerant as well.  Her best option would be PCSK9 inhibitor therapy based on current guidelines with at least 50% reduction of cholesterol given LDL greater than 190.  I would recommend starting Repatha twice weekly.  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  She will have to reestablish insurance care and we will likely pursue a prescription when she reestablishes insurance coverage in a couple of months.  At that time we may wish to investigate genetic testing as well.  In the interim, I advised her 3 children be screened for elevated cholesterol and if we are able to identify a genetic mutation then they could be screened for this as well.  Thanks again for the kind referral.  Plan to see her back in follow-up in 2 to 3 months.  Chrystie Nose, MD, [facility], FACP  [place]  [facility]  Medical Director of the [facility]  [facility] Diplomate of the  American Board of Clinical Lipidology Attending Cardiologist  Direct Dial: [phone]  Fax: [phone]  Website:  [email or web address] 02/24/2018, 11:19 AM

## 2018-02-23 NOTE — Patient Instructions (Addendum)
Dr. Rennis Golden recommends Repatha (PCSK9). This is an injectable cholesterol medication self-administered every 14 days. This medication will need prior approval with your insurance company, which we will work on. If the medication is not approved initially, we may need to do an appeal with your insurance. We will keep you updated on this process. This medication can be provided at some local pharmacies or be shipped to your from a specialty pharmacy.

## 2018-02-24 ENCOUNTER — Encounter: Payer: Self-pay | Admitting: Internal Medicine

## 2018-02-24 DIAGNOSIS — I10 Essential (primary) hypertension: Secondary | ICD-10-CM | POA: Insufficient documentation

## 2018-02-24 DIAGNOSIS — Z789 Other specified health status: Secondary | ICD-10-CM | POA: Insufficient documentation

## 2018-02-24 DIAGNOSIS — E7849 Other hyperlipidemia: Secondary | ICD-10-CM | POA: Insufficient documentation

## 2018-03-26 ENCOUNTER — Ambulatory Visit: Payer: BLUE CROSS/BLUE SHIELD | Admitting: Internal Medicine

## 2018-05-03 ENCOUNTER — Telehealth: Payer: Self-pay | Admitting: Internal Medicine

## 2018-05-03 NOTE — Telephone Encounter (Signed)
LM for patient to call back to discuss if she is interested in pursuing Repatha. If so, does she have new insurance coverage. If yes, will need to obtain this information to process prior authorization. 

## 2018-06-03 ENCOUNTER — Telehealth: Payer: Self-pay | Admitting: Internal Medicine

## 2018-06-03 NOTE — Telephone Encounter (Signed)
LM for patient to call back to discuss if she is interested in pursuing Repatha. If so, does she have new insurance coverage. If yes, will need to obtain this information to process prior authorization.

## 2019-07-29 DIAGNOSIS — E039 Hypothyroidism, unspecified: Secondary | ICD-10-CM | POA: Diagnosis not present

## 2019-07-29 DIAGNOSIS — I10 Essential (primary) hypertension: Secondary | ICD-10-CM | POA: Diagnosis not present

## 2019-07-29 DIAGNOSIS — J449 Chronic obstructive pulmonary disease, unspecified: Secondary | ICD-10-CM | POA: Diagnosis not present

## 2019-07-29 DIAGNOSIS — E7800 Pure hypercholesterolemia, unspecified: Secondary | ICD-10-CM | POA: Diagnosis not present

## 2019-08-11 ENCOUNTER — Other Ambulatory Visit: Payer: Self-pay | Admitting: Family Medicine

## 2019-08-11 DIAGNOSIS — Z1231 Encounter for screening mammogram for malignant neoplasm of breast: Secondary | ICD-10-CM

## 2019-10-06 DIAGNOSIS — R7301 Impaired fasting glucose: Secondary | ICD-10-CM | POA: Diagnosis not present

## 2019-10-06 DIAGNOSIS — E782 Mixed hyperlipidemia: Secondary | ICD-10-CM | POA: Diagnosis not present

## 2019-10-07 ENCOUNTER — Ambulatory Visit
Admission: RE | Admit: 2019-10-07 | Discharge: 2019-10-07 | Disposition: A | Discharge status: Home / Self Care | Payer: BC Managed Care – PPO | Source: Ambulatory Visit | Attending: Family Medicine | Admitting: Family Medicine

## 2019-10-07 ENCOUNTER — Other Ambulatory Visit: Payer: Self-pay

## 2019-10-07 DIAGNOSIS — Z1231 Encounter for screening mammogram for malignant neoplasm of breast: Secondary | ICD-10-CM | POA: Diagnosis not present

## 2019-10-12 DIAGNOSIS — E782 Mixed hyperlipidemia: Secondary | ICD-10-CM | POA: Diagnosis not present

## 2019-10-12 DIAGNOSIS — E119 Type 2 diabetes mellitus without complications: Secondary | ICD-10-CM | POA: Diagnosis not present

## 2020-12-26 ENCOUNTER — Other Ambulatory Visit: Payer: Self-pay | Admitting: Family Medicine

## 2020-12-26 DIAGNOSIS — Z1231 Encounter for screening mammogram for malignant neoplasm of breast: Secondary | ICD-10-CM

## 2021-02-15 ENCOUNTER — Ambulatory Visit
Admission: RE | Admit: 2021-02-15 | Discharge: 2021-02-15 | Disposition: A | Discharge status: Home / Self Care | Payer: 59 | Source: Ambulatory Visit | Attending: Family Medicine | Admitting: Family Medicine

## 2021-02-15 ENCOUNTER — Other Ambulatory Visit: Payer: Self-pay

## 2021-02-15 DIAGNOSIS — Z1231 Encounter for screening mammogram for malignant neoplasm of breast: Secondary | ICD-10-CM

## 2021-04-03 ENCOUNTER — Other Ambulatory Visit: Payer: Self-pay

## 2021-04-03 ENCOUNTER — Emergency Department (HOSPITAL_COMMUNITY): Payer: 59

## 2021-04-03 ENCOUNTER — Emergency Department (HOSPITAL_COMMUNITY)
Admission: EM | Admit: 2021-04-03 | Discharge: 2021-04-04 | Disposition: A | Discharge status: Home / Self Care | Payer: 59 | Attending: Emergency Medicine | Admitting: Emergency Medicine

## 2021-04-03 DIAGNOSIS — R0602 Shortness of breath: Secondary | ICD-10-CM | POA: Insufficient documentation

## 2021-04-03 DIAGNOSIS — M542 Cervicalgia: Secondary | ICD-10-CM | POA: Diagnosis not present

## 2021-04-03 DIAGNOSIS — Z5321 Procedure and treatment not carried out due to patient leaving prior to being seen by health care provider: Secondary | ICD-10-CM | POA: Diagnosis not present

## 2021-04-03 DIAGNOSIS — Z20822 Contact with and (suspected) exposure to covid-19: Secondary | ICD-10-CM | POA: Insufficient documentation

## 2021-04-03 LAB — BASIC METABOLIC PANEL
Anion gap: 11 (ref 5–15)
BUN: 12 mg/dL (ref 6–20)
CO2: 24 mmol/L (ref 22–32)
Calcium: 9.7 mg/dL (ref 8.9–10.3)
Chloride: 97 mmol/L — ABNORMAL LOW (ref 98–111)
Creatinine, Ser: 0.6 mg/dL (ref 0.44–1.00)
GFR, Estimated: >60 mL/min (ref >60)
Glucose, Bld: 229 mg/dL — ABNORMAL HIGH (ref 70–99)
Potassium: 3.9 mmol/L (ref 3.5–5.1)
Sodium: 132 mmol/L — ABNORMAL LOW (ref 135–145)

## 2021-04-03 LAB — CBC WITH DIFFERENTIAL/PLATELET
Abs Immature Granulocytes: 0.08 10*3/uL — ABNORMAL HIGH (ref 0.00–0.07)
Basophils Absolute: 0.1 10*3/uL (ref 0.0–0.1)
Basophils Relative: 1 %
Eosinophils Absolute: 0.2 10*3/uL (ref 0.0–0.5)
Eosinophils Relative: 2 %
HCT: 38 % (ref 36.0–46.0)
Hemoglobin: 12.8 g/dL (ref 12.0–15.0)
Immature Granulocytes: 1 %
Lymphocytes Relative: 19 %
Lymphs Abs: 1.8 10*3/uL (ref 0.7–4.0)
MCH: 31.3 pg (ref 26.0–34.0)
MCHC: 33.7 g/dL (ref 30.0–36.0)
MCV: 92.9 fL (ref 80.0–100.0)
Monocytes Absolute: 0.9 10*3/uL (ref 0.1–1.0)
Monocytes Relative: 9 %
Neutro Abs: 6.7 10*3/uL (ref 1.7–7.7)
Neutrophils Relative %: 68 %
Platelets: 372 10*3/uL (ref 150–400)
RBC: 4.09 MIL/uL (ref 3.87–5.11)
RDW: 12.9 % (ref 11.5–15.5)
WBC: 9.8 10*3/uL (ref 4.0–10.5)
nRBC: 0 % (ref 0.0–0.2)

## 2021-04-03 LAB — D-DIMER, QUANTITATIVE: D-Dimer, Quant: 0.38 ug/mL-FEU (ref 0.00–0.50)

## 2021-04-03 NOTE — ED Provider Notes (Signed)
Emergency Medicine Provider Triage Evaluation Note  [name] , a 55 y.o. female  was evaluated in triage.  Pt complains of shortness of breath.  Patient was admitted was at its worst yesterday and has gradually improved since then.  Patient productive cough.  Patient OB/GYN primary care provider concern for abdominal pain.  Patient reports that she does not have any chest x-ray, D-dimer, borderline COVID testing after primary care provider's office.  Patient denies any hemoptysis, history of cancer, unilateral leg swelling or tenderness, surgery in the last 12 weeks, history of DVT or PE.  Review of Systems  Positive: Shortness of breath, productive follow-up Negative: Chest pain, leg swelling,  Physical Exam  BP 116/87 (BP Location: Left Arm)   Pulse (!) 118   Temp 100.1 F (37.8 C) (Oral)   Resp 18   LMP 05/03/2013   SpO2 98%  Gen:   Awake, no distress   Resp:  Normal effort, lungs clear to auscultation MSK:   Moves extremities without difficulty  Other:  No swelling or tenderness to bilateral lower extremities  Medical Decision Making  Medically screening exam initiated at 7:16 PM.  Appropriate orders placed.  [name] was informed that the remainder of the evaluation will be completed by another provider, this initial triage assessment does not replace that evaluation, and the importance of remaining in the ED until their evaluation is complete.  The patient appears stable so that the remainder of the work up may be completed by another provider.      Haskel Schroeder, PA-C 04/03/21 1917    Rozelle Logan, DO 04/03/21 2228

## 2021-04-03 NOTE — ED Triage Notes (Signed)
C/O shortness of breath x 1 week; with activity and rest; c/o tightness in back of neck as well. Stated was sent by OP doc for PE eval.

## 2021-04-04 ENCOUNTER — Encounter (HOSPITAL_BASED_OUTPATIENT_CLINIC_OR_DEPARTMENT_OTHER): Payer: Self-pay

## 2021-04-04 ENCOUNTER — Emergency Department (HOSPITAL_BASED_OUTPATIENT_CLINIC_OR_DEPARTMENT_OTHER)
Admission: EM | Admit: 2021-04-04 | Discharge: 2021-04-04 | Disposition: A | Discharge status: Home / Self Care | Payer: 59 | Source: Home / Self Care | Attending: Emergency Medicine | Admitting: Emergency Medicine

## 2021-04-04 ENCOUNTER — Other Ambulatory Visit: Payer: Self-pay

## 2021-04-04 DIAGNOSIS — Z87891 Personal history of nicotine dependence: Secondary | ICD-10-CM | POA: Insufficient documentation

## 2021-04-04 DIAGNOSIS — I10 Essential (primary) hypertension: Secondary | ICD-10-CM | POA: Insufficient documentation

## 2021-04-04 DIAGNOSIS — R062 Wheezing: Secondary | ICD-10-CM

## 2021-04-04 DIAGNOSIS — Z79899 Other long term (current) drug therapy: Secondary | ICD-10-CM | POA: Insufficient documentation

## 2021-04-04 DIAGNOSIS — E039 Hypothyroidism, unspecified: Secondary | ICD-10-CM | POA: Insufficient documentation

## 2021-04-04 DIAGNOSIS — R5383 Other fatigue: Secondary | ICD-10-CM | POA: Insufficient documentation

## 2021-04-04 DIAGNOSIS — R059 Cough, unspecified: Secondary | ICD-10-CM | POA: Insufficient documentation

## 2021-04-04 DIAGNOSIS — R6883 Chills (without fever): Secondary | ICD-10-CM | POA: Insufficient documentation

## 2021-04-04 DIAGNOSIS — R0602 Shortness of breath: Secondary | ICD-10-CM | POA: Insufficient documentation

## 2021-04-04 DIAGNOSIS — J449 Chronic obstructive pulmonary disease, unspecified: Secondary | ICD-10-CM | POA: Insufficient documentation

## 2021-04-04 LAB — SARS CORONAVIRUS 2 (TAT 6-24 HRS): SARS Coronavirus 2: NEGATIVE

## 2021-04-04 LAB — TROPONIN I (HIGH SENSITIVITY): Troponin I (High Sensitivity): 3 ng/L

## 2021-04-04 MED ORDER — IPRATROPIUM-ALBUTEROL 0.5-2.5 (3) MG/3ML IN SOLN
3.0000 mL | Freq: Once | RESPIRATORY_TRACT | Status: AC
  Administered 2021-04-04: 3 mL via RESPIRATORY_TRACT
  Filled 2021-04-04: qty 3

## 2021-04-04 NOTE — ED Provider Notes (Signed)
[facility] EMERGENCY DEPT Provider Note   CSN: [id number] Arrival date & time: 04/04/21  1503     History Chief Complaint  Patient presents with   Shortness of Breath    [name] is a 55 y.o. female.  HPI  55 year old female with past medical history of COPD, HTN, anemia presents the emergency department with shortness of breath.  Patient states that this started about a week ago.  She feels fatigued and short of breath with a dry cough.  She has had chills at home but no documented fever.  She states she specifically feels exertionally short of breath.  Denies any history of DVT/PE.  No swelling of her lower extremities.  No history of cardiac disease or CHF.  Of note patient waited at [facility] yesterday to be seen with the same complaint but left without being seen.  Blood work and x-rays were done.  Past Medical History:  Diagnosis Date   Anemia    Arthritis    Complication of anesthesia    "wakes up fighting"   COPD (chronic obstructive pulmonary disease) (HCC)    Headache(784.0)    long time ago   History of blood transfusion    after tonsils removed   Hypertension    Hypothyroidism    Thyroid disease     Patient Active Problem List   Diagnosis Date Noted   Familial hyperlipidemia 02/24/2018   Essential hypertension 02/24/2018   Statin intolerance 02/24/2018   Fracture of ankle, bimalleolar, right, closed 07/19/2012    Class: Acute    Past Surgical History:  Procedure Laterality Date   APPENDECTOMY     BACK SURGERY     Laminectomy   CHOLECYSTECTOMY     ORIF ANKLE FRACTURE  07/19/2012   Procedure: OPEN REDUCTION INTERNAL FIXATION (ORIF) ANKLE FRACTURE;  Surgeon: Eldred Manges, MD;  Location: [facility] OR;  Service: Orthopedics;  Laterality: Right;  open reduction internal fixation right bimalleolar ankle fracture   TONSILLECTOMY     TYMPANOSTOMY TUBE PLACEMENT     WISDOM TOOTH EXTRACTION       OB History   No obstetric history on file.      Family History  Problem Relation Age of Onset   Breast cancer Paternal Aunt     Social History   Tobacco Use   Smoking status: Former    Packs/day: 1.00    Years: 20.00    Pack years: 20.00    Types: Cigarettes   Smokeless tobacco: Never  Substance Use Topics   Alcohol use: Not Currently    Alcohol/week: 6.0 standard drinks    Types: 6 Glasses of wine per week   Drug use: No    Home Medications Prior to Admission medications   Medication Sig Start Date End Date Taking? Authorizing Provider  Ascorbic Acid (VITAMIN C) 100 MG tablet Take 100 mg by mouth daily.   Yes [provider]  CALCIUM PO Take 1 tablet by mouth daily as needed (supplement).   Yes [provider]  etodolac (LODINE) 400 MG tablet Take 400 mg by mouth 2 (two) times daily. 08/28/16  Yes [provider]  fluticasone (FLONASE) 50 MCG/ACT nasal spray Place 1 spray into both nostrils daily as needed for allergies or rhinitis.   Yes [provider]  levothyroxine (SYNTHROID, LEVOTHROID) 125 MCG tablet Take 125 mcg by mouth daily. 08/16/16  Yes [provider]  lisinopril (PRINIVIL,ZESTRIL) 10 MG tablet Take 10 mg by mouth daily.  Yes [provider]  Evolocumab 140 MG/ML SOAJ Inject into the skin every 14 (fourteen) days.    [provider]    Allergies    Statins  Review of Systems   Review of Systems  Constitutional:  Positive for chills and fatigue. Negative for fever.  HENT:  Negative for congestion.   Eyes:  Negative for visual disturbance.  Respiratory:  Positive for cough and shortness of breath. Negative for chest tightness.   Cardiovascular:  Negative for chest pain, palpitations and leg swelling.  Gastrointestinal:  Negative for abdominal pain, diarrhea and vomiting.  Genitourinary:  Negative for dysuria.  Skin:  Negative for rash.  Neurological:  Negative for headaches.   Physical Exam Updated Vital Signs BP 106/74 (BP  Location: Right Arm)   Pulse 96   Temp 98.7 F (37.1 C)   Resp (!) 21   Ht 5\' 3"  (1.6 m)   Wt 77.1 kg   LMP 05/03/2013   SpO2 95%   BMI 30.11 kg/m   Physical Exam Vitals and nursing note reviewed.  Constitutional:      General: She is not in acute distress.    Appearance: Normal appearance. She is ill-appearing. She is not toxic-appearing.  HENT:     Head: Normocephalic.     Mouth/Throat:     Mouth: Mucous membranes are moist.  Cardiovascular:     Rate and Rhythm: Normal rate.  Pulmonary:     Effort: Pulmonary effort is normal. No respiratory distress.     Breath sounds: Examination of the right-lower field reveals wheezing. Examination of the left-lower field reveals wheezing. Wheezing present.  Abdominal:     Palpations: Abdomen is soft.     Tenderness: There is no abdominal tenderness.  Musculoskeletal:     Right lower leg: No edema.     Left lower leg: No edema.  Skin:    General: Skin is warm.  Neurological:     Mental Status: She is alert and oriented to person, place, and time. Mental status is at baseline.  Psychiatric:        Mood and Affect: Mood normal.    ED Results / Procedures / Treatments   Labs (all labs ordered are listed, but only abnormal results are displayed) Labs Reviewed  TROPONIN I (HIGH SENSITIVITY)    EKG EKG Interpretation  Date/Time:  Thursday April 04 2021 15:20:07 EDT Ventricular Rate:  115 PR Interval:  164 QRS Duration: 82 QT Interval:  316 QTC Calculation: 437 R Axis:   76 Text Interpretation: Sinus tachycardia Cannot rule out Anterior infarct , age undetermined Abnormal ECG Sinus tachycardia Confirmed by 01-11-2000 [phone]) on 04/04/2021 4:20:23 PM  Radiology DG Chest 2 View  Result Date: 04/03/2021 CLINICAL DATA:  Shortness of breath and cough. EXAM: CHEST - 2 VIEW COMPARISON:  07/16/2012 FINDINGS: Hyperinflation suggesting emphysema. Heart size and pulmonary vascularity are normal. Mild peribronchial thickening and  central interstitial pattern suggesting acute or chronic bronchitis. No focal consolidation or airspace disease. No pleural effusions. No pneumothorax. Mediastinal contours appear intact. Minimal aortic calcification. IMPRESSION: Emphysematous and bronchitic changes in the lungs. No focal consolidation. Electronically Signed   By: 07/18/2012 M.D.   On: 04/03/2021 19:55    Procedures Procedures   Medications Ordered in ED Medications - No data to display  ED Course  I have reviewed the triage vital signs and the nursing notes.  Pertinent labs & imaging results that were available during my care of the patient were reviewed  by me and considered in my medical decision making (see chart for details).    MDM Rules/Calculators/A&P                          55 year old female presents emergency department with shortness of breath especially on exertion for the past week, worse yesterday.  Patient was triaged at [facility] yesterday and had blood work done but left before being seen.  Her chest x-ray showed bronchitic and emphysematous changes in the lungs without any other focal consolidation, COVID swab is negative, D-dimer was normal, other blood work showed mild hyponatremia without any other acute finding.  Blood work was done less than 24 hours ago, no need for repeat today.  She comes today with ongoing symptoms.  Was tachycardic on arrival but this resolved in the room.  On lung auscultation she has scattered wheezes at the bases.  EKG is nonischemic, troponin is negative.  Low suspicion for ACS, PE.  With scattered wheezing she was given nebulizer treatment and felt slightly improved.  Plan for outpatient follow-up.  Patient will be discharged and treated as an outpatient.  Discharge plan and strict return to ED precautions discussed, patient verbalizes understanding and agreement.  Final Clinical Impression(s) / ED Diagnoses Final diagnoses:  None    Rx / DC Orders ED Discharge  Orders     None        Rozelle Logan, DO 04/04/21 1959

## 2021-04-04 NOTE — ED Notes (Signed)
RT performed COPD symptom assessment on pt as well as educated pt on COPD. RT explained to pt the importance of avoiding instances where her COPD may be exacerbated such as; extreme heat, humidity, other environmental triggers, avoid perfumes and scented products, as well as control of any seasonal allergies. Pt expressed verbally her understanding of information given. RT will continue to monitor.

## 2021-04-04 NOTE — ED Notes (Signed)
Pt came to ask how many people were still in front of her, I told her and she said she couldn't wait any longer and left. Moving her OTF.

## 2021-04-04 NOTE — ED Triage Notes (Signed)
Pt presents to ED from home SOB with exertion X 1 week. Went to [facility] ED yesterday but left without treatment complete.

## 2021-04-04 NOTE — Discharge Instructions (Addendum)
You have been seen and discharged from the emergency department.  Your cardiac work-up, lab work, COVID test and chest x-ray were normal.  You had some inflammation/wheezing treated with a breathing treatment.  Follow-up with your primary provider for reevaluation and further care. Take home medications as prescribed. If you have any worsening symptoms or further concerns for your health please return to an emergency department for further evaluation.

## 2021-09-24 ENCOUNTER — Encounter (HOSPITAL_BASED_OUTPATIENT_CLINIC_OR_DEPARTMENT_OTHER): Payer: Self-pay | Admitting: Internal Medicine

## 2021-10-02 ENCOUNTER — Ambulatory Visit (HOSPITAL_BASED_OUTPATIENT_CLINIC_OR_DEPARTMENT_OTHER): Payer: 59 | Admitting: Internal Medicine

## 2021-10-02 ENCOUNTER — Other Ambulatory Visit: Payer: Self-pay

## 2021-10-02 ENCOUNTER — Encounter (HOSPITAL_BASED_OUTPATIENT_CLINIC_OR_DEPARTMENT_OTHER): Payer: Self-pay | Admitting: Internal Medicine

## 2021-10-02 VITALS — BP 128/82 | HR 90 | Ht 66.0 in | Wt 176.4 lb

## 2021-10-02 DIAGNOSIS — E7849 Other hyperlipidemia: Secondary | ICD-10-CM | POA: Diagnosis not present

## 2021-10-02 DIAGNOSIS — I10 Essential (primary) hypertension: Secondary | ICD-10-CM | POA: Diagnosis not present

## 2021-10-02 DIAGNOSIS — T466X5A Adverse effect of antihyperlipidemic and antiarteriosclerotic drugs, initial encounter: Secondary | ICD-10-CM

## 2021-10-02 DIAGNOSIS — M791 Myalgia, unspecified site: Secondary | ICD-10-CM | POA: Diagnosis not present

## 2021-10-02 NOTE — Patient Instructions (Signed)
Medication Instructions:  Dr. Rennis Golden recommends Repatha 140mg /mL OR Praluent 150mg /mL (PCSK9). This is an injectable cholesterol medication self-administered once every 14 days. This medication will likely need prior approval with your insurance company, which we will work on. If the medication is not approved initially, we may need to do an appeal with your insurance.   Administer medication in area of fatty tissue such as abdomen, outer thigh, back of upper arm - and rotate site with each injection Store medication in refrigerator until ready to administer - allow to sit at room temp for 30 mins - 1 hour prior to injection Dispose of medication in a SHARPS container - your pharmacy should be able to direct you on this and proper disposal   If you need a co-pay card for Repatha: >> paying for Repatha or red box that says "Repatha Copay Card" in top right If you need a co-pay card for Praluent: >> starting & paying for Praluent  Patient Assistance:   The [employer] Foundation: https://www.panfoundation.org/disease-funds/hypercholesterolemia/ -- can sign up for wait list  The [employer] foundation offers assistance to help pay for medication copays.  They will cover copays for all cholesterol lowering meds, including statins, fibrates, omega-3 fish oils like Vascepa, ezetimibe, Repatha, Praluent, Nexletol, Nexlizet.  The cards are usually good for $2,500 or 12 months, whichever comes first. Go to healthwellfoundation.org Click on Apply Now Answer questions as to whom is applying (patient or representative) Your disease fund will be hypercholesterolemia - Medicare access They will ask questions about finances and which medications you are taking for cholesterol When you submit, the approval is usually within minutes.  You will need to print the card information from the site You will need to show this information to your pharmacy, they will bill your Medicare Part D plan  first -then bill [employer] Well --for the copay.   You can also call them at [phone], although the hold times can be quite long.     *If you need a refill on your cardiac medications before your next appointment, please call your pharmacy*   Lab Work: FASTING lab work to check cholesterol in about 3-4 months  - complete about 1 week before your next visit with [email or web address]. [email or web address]   If you have labs (blood work) drawn today and your tests are completely normal, you will receive your results only by: MyChart Message (if you have MyChart) OR A paper copy in the mail If you have any lab test that is abnormal or we need to change your treatment, we will call you to review the results.   Testing/Procedures: NONE   Follow-Up: At [facility], you and your health needs are our priority.  As part of our continuing mission to provide you with exceptional heart care, we have created designated Provider Care Teams.  These Care Teams include your primary Cardiologist (physician) and Advanced Practice Providers (APPs -  Physician Assistants and Nurse Practitioners) who all work together to provide you with the care you need, when you need it.  We recommend signing up for the patient portal called "MyChart".  Sign up information is provided on this After Visit Summary.  MyChart is used to connect with patients for Virtual Visits (Telemedicine).  Patients are able to view lab/test results, encounter notes, upcoming appointments, etc.  Non-urgent messages can be sent to your provider as well.   To learn more about what you can do with MyChart, go to Rennis Golden.    Your next  appointment:   4 month(s)  The format for your next appointment:   In Person  Provider:   Dr. Zoila Shutter

## 2021-10-02 NOTE — Progress Notes (Signed)
OFFICE CONSULT NOTE  Chief Complaint:   Follow-up possible FH  Primary Care Physician: Blair Heys, MD  HPI:  [name] is a 56 y.o. female who is being seen today for the evaluation of possible FH at the request of Blair Heys, MD.  This is a pleasant 57 year old female with a long-standing history of elevated cholesterol.  There is a strong family history of dyslipidemia with her father who died of an MI at age 31.  Her mom also had elevated cholesterol.  She has a brother that she does not speak much with and 3 children, 2 daughters and 1 son all in their 33s who she does not believe have had cholesterol testing.  She has no known coronary disease, but has been on multiple cholesterol medications in the past including rosuvastatin, atorvastatin, simvastatin, pravastatin and ezetimibe.  All of which caused myalgias.  Recent lab work from April 2019 showed total cholesterol 340, HDL 60, LDL 258 and triglycerides 588.  Hemoglobin A1c of 6.4 suggestive of impaired fasting glucose.  Pressure has been well controlled.  Unfortunately she is also a smoker with a 20-pack-year smoking history and does have some mild COPD as well as hypothyroidism on levothyroxine and mild hypertension on lisinopril.  10/02/2021  [name] is here today to follow-up on her dyslipidemia.  I last saw her in 2019 but she is not considered a new patient.  She likely has a familial hyperlipidemia with LDL cholesterol that is been well over 200.  Unfortunately she has been intolerant of numerous statins.  We had discussed a PCSK9 inhibitor however cost was prohibitive and she was about to lose a job she had.  Subsequently she did find another job but unfortunately her husband was diagnosed with stage IV lung cancer.  In addition to that over the summer she seen in the ER with shortness of breath and found to have COPD.  She stopped smoking.  Her last lipid profile in August 2022 remained elevated with total  cholesterol of 330, HDL 76, triglycerides of 147 and LDL 228.  PMHx:  Past Medical History:  Diagnosis Date   Anemia    Arthritis    Complication of anesthesia    "wakes up fighting"   COPD (chronic obstructive pulmonary disease) (HCC)    Headache(784.0)    long time ago   History of blood transfusion    after tonsils removed   Hypertension    Hypothyroidism    Thyroid disease     Past Surgical History:  Procedure Laterality Date   APPENDECTOMY     BACK SURGERY     Laminectomy   CHOLECYSTECTOMY     ORIF ANKLE FRACTURE  07/19/2012   Procedure: OPEN REDUCTION INTERNAL FIXATION (ORIF) ANKLE FRACTURE;  Surgeon: Eldred Manges, MD;  Location: [facility];  Service: Orthopedics;  Laterality: Right;  open reduction internal fixation right bimalleolar ankle fracture   TONSILLECTOMY     TYMPANOSTOMY TUBE PLACEMENT     WISDOM TOOTH EXTRACTION      FAMHx:  Family History  Problem Relation Age of Onset   Breast cancer Paternal Aunt     SOCHx:   reports that she has quit smoking. Her smoking use included cigarettes. She has a 20.00 pack-year smoking history. She has never used smokeless tobacco. She reports that she does not currently use alcohol after a past usage of about 6.0 standard drinks per week. She reports that she does not use drugs.  ALLERGIES:  Allergies  Allergen Reactions   Fenofibrate Other (See Comments)   Hydrocodone-Acetaminophen Other (See Comments)   Statins     ROS: Pertinent items noted in HPI and remainder of comprehensive ROS otherwise negative.  HOME MEDS: Current Outpatient Medications on File Prior to Visit  Medication Sig Dispense Refill   Ascorbic Acid (VITAMIN C) 100 MG tablet Take 100 mg by mouth daily.     CALCIUM PO Take 1 tablet by mouth daily as needed (supplement).     Cyanocobalamin (VITAMIN B 12) 500 MCG TABS 1 tablet     etodolac (LODINE) 400 MG tablet Take 400 mg by mouth 2 (two) times daily.  0   Evolocumab 140 MG/ML SOAJ Inject into the  skin every 14 (fourteen) days.     fluticasone (FLONASE) 50 MCG/ACT nasal spray Place 1 spray into both nostrils daily as needed for allergies or rhinitis.     fluticasone furoate-vilanterol (BREO ELLIPTA) 100-25 MCG/ACT AEPB 1 PUFF INHALATION ONCE A DAY     glimepiride (AMARYL) 1 MG tablet 1 tablet with breakfast or the first main meal of the day     levothyroxine (SYNTHROID) 112 MCG tablet 1 tablet in the morning on an empty stomach     lisinopril (PRINIVIL,ZESTRIL) 10 MG tablet Take 10 mg by mouth daily.     metFORMIN (GLUCOPHAGE-XR) 500 MG 24 hr tablet 1 tablet     pantoprazole (PROTONIX) 40 MG tablet 1 tablet     levothyroxine (SYNTHROID, LEVOTHROID) 125 MCG tablet Take 125 mcg by mouth daily. (Patient not taking: Reported on 10/02/2021)  3   No current facility-administered medications on file prior to visit.    LABS/IMAGING: No results found for this or any previous visit (from the past 48 hour(s)). No results found.  LIPID PANEL: No results found for: CHOL, TRIG, HDL, CHOLHDL, VLDL, LDLCALC, LDLDIRECT  WEIGHTS: Wt Readings from Last 3 Encounters:  10/02/21 176 lb 6.4 oz (80 kg)  04/04/21 170 lb (77.1 kg)  02/23/18 177 lb 12.8 oz (80.6 kg)    VITALS: BP 128/82    Pulse 90    Ht 5\' 6"  (1.676 m)    Wt 176 lb 6.4 oz (80 kg)    LMP 05/03/2013    SpO2 97%    BMI 28.47 kg/m   EXAM: General appearance: alert and no distress Neck: no carotid bruit, no JVD and thyroid not enlarged, symmetric, no tenderness/mass/nodules Lungs: clear to auscultation bilaterally Heart: regular rate and rhythm Abdomen: soft, non-tender; bowel sounds normal; no masses,  no organomegaly Extremities: extremities normal, atraumatic, no cyanosis or edema Pulses: 2+ and symmetric Skin: Skin color, texture, turgor normal. No rashes or lesions Neurologic: Grossly normal : Pleasant  EKG: Deferred  ASSESSMENT: Probable familial hyperlipidemia -Dutch score of 6 / Simon-Broome criteria suggests probable  FH Hypertension Tobacco abuse Hypothyroidism Family history of premature coronary disease Statin myalgias  PLAN: 1.   [name] has probable familial hyperlipidemia.  She has an elevated Charmian Muff lipid score with a family history of premature coronary disease, hypertension and former tobacco abuse.  She was recently noted to have COPD by chest x-ray with shortness of breath.  I had previously recommended a PCSK9 inhibitor however cost was an issue.  She now has insurance and will pursue that again.  She might also be eligible for a health well grant if cost continues to be an issue.  We will plan repeat lipids in about 3 to 4 months after starting on therapy.  Follow-up with me  afterwards  Chrystie NoseKenneth C. Nyles Mitton, MD, Milagros LollFACC, FACP  [place]   [facility]  Medical Director of the [facility] Lipid [facility] &  Cardiovascular Risk Reduction Clinic Diplomate of the American Board of Clinical Lipidology Attending Cardiologist  Direct Dial: [phone]   Fax: [phone]  Website:  [email or web address]  Ethleen Lormand C Radley Barto 10/02/2021, 2:06 PM

## 2021-10-03 ENCOUNTER — Telehealth: Payer: Self-pay | Admitting: Internal Medicine

## 2021-10-03 NOTE — Telephone Encounter (Signed)
PA for repatha sureclick generated in [facility] Waiting for question response (Key: [id number])

## 2021-10-10 NOTE — Telephone Encounter (Signed)
PA submitted via [facility] Key: [id number]

## 2021-10-14 ENCOUNTER — Other Ambulatory Visit: Payer: Self-pay | Admitting: Internal Medicine

## 2021-10-14 MED ORDER — PRALUENT 150 MG/ML ~~LOC~~ SOAJ: 1.0000 | SUBCUTANEOUS | 3 refills | Status: DC

## 2021-10-14 NOTE — Telephone Encounter (Signed)
Repatha is NOT preferred with insurance plan - Praluent is preferred and no contraindication to preferred product  Was attempting coverage for this PCSK9 first, as co-pay card allows for cheaper Rx than Praluent  Spoke with patient and explained situation. She voiced understanding. Rx(s) sent to pharmacy electronically.  Provided her info on how to obtain co-pay card online

## 2021-10-14 NOTE — Telephone Encounter (Signed)
PA for praluent submitted via [employer]

## 2021-10-15 NOTE — Telephone Encounter (Addendum)
[employer] specialty to check on PA status (phone: [phone]) and they said no PA was received  Faxed PA form to [phone]

## 2021-10-18 NOTE — Telephone Encounter (Signed)
Additional PA info/form was faxed to [facility] at [phone]

## 2021-10-23 NOTE — Telephone Encounter (Signed)
Rx(s) sent to pharmacy electronically.  

## 2021-10-23 NOTE — Telephone Encounter (Signed)
Praluent approved with insurance from 10/18/21/ 1-20/24

## 2021-10-23 NOTE — Telephone Encounter (Signed)
Left message for patient that med is approved.  

## 2022-02-04 LAB — NMR, LIPOPROFILE
Cholesterol, Total: 218 mg/dL — ABNORMAL HIGH (ref 100–199)
HDL Particle Number: 43.7 umol/L (ref >=30.5)
HDL-C: 71 mg/dL (ref >39)
LDL Particle Number: 1392 nmol/L — ABNORMAL HIGH (ref <1000)
LDL Size: 21.4 nm (ref >20.5)
LDL-C (NIH Calc): 115 mg/dL — ABNORMAL HIGH (ref 0–99)
LP-IR Score: 50 — ABNORMAL HIGH (ref <=45)
Small LDL Particle Number: 442 nmol/L (ref <=527)
Triglycerides: 187 mg/dL — ABNORMAL HIGH (ref 0–149)

## 2022-02-04 LAB — LIPOPROTEIN A (LPA): Lipoprotein (a): 32.6 nmol/L (ref <75.0)

## 2022-02-05 ENCOUNTER — Ambulatory Visit (INDEPENDENT_AMBULATORY_CARE_PROVIDER_SITE_OTHER): Payer: 59 | Admitting: Internal Medicine

## 2022-02-05 ENCOUNTER — Encounter (HOSPITAL_BASED_OUTPATIENT_CLINIC_OR_DEPARTMENT_OTHER): Payer: Self-pay | Admitting: Internal Medicine

## 2022-02-05 VITALS — BP 128/80 | HR 85 | Ht 63.0 in | Wt 181.2 lb

## 2022-02-05 DIAGNOSIS — I10 Essential (primary) hypertension: Secondary | ICD-10-CM

## 2022-02-05 DIAGNOSIS — T466X5A Adverse effect of antihyperlipidemic and antiarteriosclerotic drugs, initial encounter: Secondary | ICD-10-CM

## 2022-02-05 DIAGNOSIS — T466X5D Adverse effect of antihyperlipidemic and antiarteriosclerotic drugs, subsequent encounter: Secondary | ICD-10-CM | POA: Diagnosis not present

## 2022-02-05 DIAGNOSIS — E7849 Other hyperlipidemia: Secondary | ICD-10-CM | POA: Diagnosis not present

## 2022-02-05 DIAGNOSIS — M791 Myalgia, unspecified site: Secondary | ICD-10-CM

## 2022-02-05 MED ORDER — EZETIMIBE 10 MG PO TABS: 10.0000 mg | ORAL_TABLET | Freq: Every day | ORAL | 3 refills | Status: DC

## 2022-02-05 NOTE — Patient Instructions (Signed)
Medication Instructions:  ?START zetia 10mg  daily ?CONTINUE all other current medications ? ?Please try to get a Praluent refill right before July 1 so we can get the transition to Repatha done smoothly. I will need a copy of your new insurance card, if that is to change around this time also.  ? ?*If you need a refill on your cardiac medications before your next appointment, please call your pharmacy* ? ? ?Lab Work: ?FASTING NMR lipoprofile in about 4 months -- complete before next appointment  ? ?If you have labs (blood work) drawn today and your tests are completely normal, you will receive your results only by: ?MyChart Message (if you have MyChart) OR ?A paper copy in the mail ?If you have any lab test that is abnormal or we need to change your treatment, we will call you to review the results. ? ?Follow-Up: ?At [facility], you and your health needs are our priority.  As part of our continuing mission to provide you with exceptional heart care, we have created designated Provider Care Teams.  These Care Teams include your primary Cardiologist (physician) and Advanced Practice Providers (APPs -  Physician Assistants and Nurse Practitioners) who all work together to provide you with the care you need, when you need it. ? ?We recommend signing up for the patient portal called "MyChart".  Sign up information is provided on this After Visit Summary.  MyChart is used to connect with patients for Virtual Visits (Telemedicine).  Patients are able to view lab/test results, encounter notes, upcoming appointments, etc.  Non-urgent messages can be sent to your provider as well.   ?To learn more about what you can do with MyChart, go to [facility].   ? ?Your next appointment:   ?4 month(s) ? ?The format for your next appointment:   ?In Person ? ?Provider:   ?[email or web address] MD ?

## 2022-02-05 NOTE — Progress Notes (Signed)
? ?OFFICE CONSULT NOTE ? ?Chief Complaint:  ? ?Follow-up possible FH ? ?Primary Care Physician: ?Gaynelle Arabian, MD ? ?HPI:  ?[name] is a 56 y.o. female who is being seen today for the evaluation of possible FH at the request of Gaynelle Arabian, MD.  This is a pleasant 56 year old female with a long-standing history of elevated cholesterol.  There is a strong family history of dyslipidemia with her father who died of an MI at age 39.  Her mom also had elevated cholesterol.  She has a brother that she does not speak much with and 3 children, 2 daughters and 1 son all in their 72s who she does not believe have had cholesterol testing.  She has no known coronary disease, but has been on multiple cholesterol medications in the past including rosuvastatin, atorvastatin, simvastatin, pravastatin and ezetimibe.  All of which caused myalgias.  Recent lab work from April 2019 showed total cholesterol 340, HDL 60, LDL 258 and triglycerides 113.  Hemoglobin A1c of 6.4 suggestive of impaired fasting glucose.  Pressure has been well controlled.  Unfortunately she is also a smoker with a 20-pack-year smoking history and does have some mild COPD as well as hypothyroidism on levothyroxine and mild hypertension on lisinopril. ? ?10/02/2021 ? ?[name] is here today to follow-up on her dyslipidemia.  I last saw her in 2019 but she is not considered a new patient.  She likely has a familial hyperlipidemia with LDL cholesterol that is been well over 200.  Unfortunately she has been intolerant of numerous statins.  We had discussed a PCSK9 inhibitor however cost was prohibitive and she was about to lose a job she had.  Subsequently she did find another job but unfortunately her husband was diagnosed with stage IV lung cancer.  In addition to that over the summer she seen in the ER with shortness of breath and found to have COPD.  She stopped smoking.  Her last lipid profile in August 2022 remained elevated with total  cholesterol of 330, HDL 76, triglycerides of 147 and LDL 228. ? ?02/05/2022 ? ?[name] returns today for follow-up.  She has had a marked improvement in her lipids on Praluent.  She says is very well-tolerated without any muscle side effects.  Her lipid NMR demonstrated LDL particle #1392, LDL-C115, HDL 71, triglycerides 187 and small LDL particle number of 442.  Her LDL cholesterol last summer was actually as high as 228.  Unfortunately, her insurance is changing to [place] as the preferred PCSK9 inhibitor this summer.  We will go ahead and make that adjustment.  I would like a little bit better lipids as her particle numbers remain elevated.  We discussed options for therapy.  I would advise adding ezetimibe 10 mg daily to her regimen.  I did check an LP(a) which was negative.  ? ?PMHx:  ?Past Medical History:  ?Diagnosis Date  ? Anemia   ? Arthritis   ? Complication of anesthesia   ? "wakes up fighting"  ? COPD (chronic obstructive pulmonary disease) ([place])   ? Headache(784.0)   ? long time ago  ? History of blood transfusion   ? after tonsils removed  ? Hypertension   ? Hypothyroidism   ? Thyroid disease   ? ? ?Past Surgical History:  ?Procedure Laterality Date  ? APPENDECTOMY    ? BACK SURGERY    ? Laminectomy  ? CHOLECYSTECTOMY    ? ORIF ANKLE FRACTURE  07/19/2012  ? Procedure: OPEN REDUCTION  INTERNAL FIXATION (ORIF) ANKLE FRACTURE;  Surgeon: Marybelle Killings, MD;  Location: [place];  Service: Orthopedics;  Laterality: Right;  open reduction internal fixation right bimalleolar ankle fracture  ? TONSILLECTOMY    ? TYMPANOSTOMY TUBE PLACEMENT    ? WISDOM TOOTH EXTRACTION    ? ? ?FAMHx:  ?Family History  ?Problem Relation Age of Onset  ? Breast cancer Paternal Aunt   ? ? ?SOCHx:  ? reports that she has quit smoking. Her smoking use included cigarettes. She has a 20.00 pack-year smoking history. She has never used smokeless tobacco. She reports that she does not currently use alcohol after a past usage of about 6.0  standard drinks per week. She reports that she does not use drugs. ? ?ALLERGIES:  ?Allergies  ?Allergen Reactions  ? Fenofibrate Other (See Comments)  ? Hydrocodone-Acetaminophen Other (See Comments)  ? Statins   ? ? ?ROS: ?Pertinent items noted in HPI and remainder of comprehensive ROS otherwise negative. ? ?HOME MEDS: ?Current Outpatient Medications on File Prior to Visit  ?Medication Sig Dispense Refill  ? Ascorbic Acid (VITAMIN C) 100 MG tablet Take 100 mg by mouth daily.    ? CALCIUM PO Take 1 tablet by mouth daily as needed (supplement).    ? Cyanocobalamin (VITAMIN B 12) 500 MCG TABS 1 tablet    ? etodolac (LODINE) 400 MG tablet Take 400 mg by mouth 2 (two) times daily.  0  ? fluticasone (FLONASE) 50 MCG/ACT nasal spray Place 1 spray into both nostrils daily as needed for allergies or rhinitis.    ? fluticasone furoate-vilanterol (BREO ELLIPTA) 100-25 MCG/ACT AEPB 1 PUFF INHALATION ONCE A DAY    ? glimepiride (AMARYL) 1 MG tablet 1 tablet with breakfast or the first main meal of the day    ? levothyroxine (SYNTHROID) 100 MCG tablet Take 100 mcg by mouth daily.    ? lisinopril (PRINIVIL,ZESTRIL) 10 MG tablet Take 10 mg by mouth daily.    ? metFORMIN (GLUCOPHAGE-XR) 500 MG 24 hr tablet 1 tablet    ? pantoprazole (PROTONIX) 40 MG tablet 1 tablet    ? PRALUENT 150 MG/ML SOAJ INJECT 1 DOSE INTO THE SKIN EVERY 14 (FOURTEEN) DAYS. 6 mL 3  ? ?No current facility-administered medications on file prior to visit.  ? ? ?LABS/IMAGING: ?Results for orders placed or performed in visit on 10/02/21 (from the past 48 hour(s))  ?NMR, lipoprofile     Status: Abnormal  ? Collection Time: 02/03/22  3:34 PM  ?Result Value Ref Range  ? LDL Particle Number [id number] (H) <1,000 nmol/L  ?  Comment:                           Low                   < 1000 ?                          Moderate         1000 - 1299 ?                          Borderline-High  1300 - 1599 ?                          High  1600 - 2000 ?                           Very High             > 2000 ?  ? LDL-C (NIH Calc) 115 (H) 0 - 99 mg/dL  ?  Comment:                           Optimal               <  100 ?                          Above optimal     100 -  129 ?                          Borderline        130 -  159 ?                          High              160 -  189 ?                          Very high             >  189 ?  ? HDL-C 71 >39 mg/dL  ? Triglycerides 187 (H) 0 - 149 mg/dL  ? Cholesterol, Total 218 (H) 100 - 199 mg/dL  ? HDL Particle Number [id number] >=30.5 umol/L  ? Small LDL Particle Number 442 <=527 nmol/L  ? LDL Size 21.4 >20.5 nm  ?  Comment:  ---------------------------------------------------------- ?                 ** INTERPRETATIVE INFORMATION** ?                 PARTICLE CONCENTRATION AND SIZE ?                    <--Lower CVD Risk   Higher CVD Risk--> ?  LDL AND HDL PARTICLES   Percentile in Reference Population ?  HDL-P (total)        High     75th    50th    25th   Low ?                       >34.9    34.9    30.5    26.7   <26.7 ?  Small LDL-P          Low      25th    50th    75th   High ?                       <117     117     527     839    >839 ?  LDL Size   <-Large (Pattern A)->    <-Small (Pattern B)-> ?                    23.0    20.6           20.5  19.0 ? ---------------------------------------------------------- ?Small LDL-P and LDL Size are associated with CVD risk, but not after ?LDL-P is taken into account. ?  ? LP-IR Score 50 (H) <=45  ?  Comment: INSULIN RESISTANCE MARKER ?    <--Insulin Sensitive    Insulin Resistant--> ?           Percentile in Reference Population ?Insulin Resistance Score ?LP-IR Score   Low   25th   50th   75th   High ?              <27   27     45     63     >63 ?LP-IR Score is inaccurate if patient is non-fasting. ?The LP-IR score is a laboratory developed index that has been ?associated with insulin resistance and diabetes risk and should be ?used as one component of a physician's clinical assessment. ?   ?Lipoprotein A (LPA)     Status: None  ? Collection Time: 02/03/22  3:34 PM  ?Result Value Ref Range  ? Lipoprotein (a) 32.6 <75.0 nmol/L  ?  Comment: Note:  Values greater than or equal to 75.0 nmol/L may ?

## 2022-02-10 ENCOUNTER — Telehealth: Payer: Self-pay | Admitting: Internal Medicine

## 2022-02-10 NOTE — Telephone Encounter (Signed)
Pt c/o medication issue: ? ?1. Name of Medication: Repatha  ? ?2. How are you currently taking this medication (dosage and times per day)? N/A ? ?3. Are you having a reaction (difficulty breathing--STAT)? No  ? ?4. What is your medication issue? Patient is calling wanting to know if she is supposed to be on Repatha. Reports she thought she was supposed to be on Praluent and Repatha, but when she called the pharmacy they advised they do not have Repatha.    ?

## 2022-02-10 NOTE — Telephone Encounter (Signed)
Just confirming with you Praluent only, not repatha?  ?

## 2022-02-10 NOTE — Telephone Encounter (Signed)
Spoke with patient of Dr. Rennis Golden  ? ?Explained that on July 1, her insurance changes preferred PCSK9i from Praluent >> Repatha. Advised we cannot change Rx until this as insurance will not cover. Explained she will continue Praluent until then. [facility] but per [facility] review, Rx was sent Jan 2023 with 90 day supply and 3 refills.  ? ?Advised her I will call pharmacy to check on this ?

## 2022-02-11 NOTE — Telephone Encounter (Signed)
Called pharmacy - they have plenty of Praluent refills on file, PA is still active.  ? ?Attempted to call patient to notify her of this but her VM is full  ?

## 2022-03-31 MED ORDER — REPATHA SURECLICK 140 MG/ML ~~LOC~~ SOAJ: 1.0000 | SUBCUTANEOUS | 2 refills | Status: DC

## 2022-03-31 NOTE — Addendum Note (Signed)
Addended by: Lindell Spar on: 03/31/2022 03:56 PM   Modules accepted: Orders

## 2022-03-31 NOTE — Telephone Encounter (Signed)
Attempted to call patient to update her on change from Praluent >> Repatha  Patient's VM is full  Called [facility] and was notified PA approval carried over from Praluent to Repatha.  Authorized until Jan. 20, 2024  [facility]: [email or web address]

## 2022-04-22 ENCOUNTER — Other Ambulatory Visit (HOSPITAL_COMMUNITY)
Admission: RE | Admit: 2022-04-22 | Discharge: 2022-04-22 | Disposition: A | Discharge status: Home / Self Care | Payer: 59 | Source: Ambulatory Visit | Attending: Obstetrics and Gynecology | Admitting: Obstetrics and Gynecology

## 2022-04-22 DIAGNOSIS — Z01419 Encounter for gynecological examination (general) (routine) without abnormal findings: Secondary | ICD-10-CM | POA: Insufficient documentation

## 2022-04-25 LAB — CYTOLOGY - PAP
Comment: NEGATIVE
Diagnosis: NEGATIVE
High risk HPV: NEGATIVE

## 2022-06-03 ENCOUNTER — Ambulatory Visit (INDEPENDENT_AMBULATORY_CARE_PROVIDER_SITE_OTHER): Payer: 59 | Admitting: Internal Medicine

## 2022-06-03 ENCOUNTER — Encounter (HOSPITAL_BASED_OUTPATIENT_CLINIC_OR_DEPARTMENT_OTHER): Payer: Self-pay | Admitting: Internal Medicine

## 2022-06-03 VITALS — BP 132/89 | HR 87 | Ht 63.0 in | Wt 183.8 lb

## 2022-06-03 DIAGNOSIS — I10 Essential (primary) hypertension: Secondary | ICD-10-CM | POA: Diagnosis not present

## 2022-06-03 DIAGNOSIS — M791 Myalgia, unspecified site: Secondary | ICD-10-CM

## 2022-06-03 DIAGNOSIS — T466X5D Adverse effect of antihyperlipidemic and antiarteriosclerotic drugs, subsequent encounter: Secondary | ICD-10-CM | POA: Diagnosis not present

## 2022-06-03 DIAGNOSIS — E7849 Other hyperlipidemia: Secondary | ICD-10-CM | POA: Diagnosis not present

## 2022-06-03 NOTE — Progress Notes (Signed)
OFFICE CONSULT NOTE  Chief Complaint:   Follow-up FH  Primary Care Physician: Blair Heys, MD  HPI:  [name] is a 56 y.o. female who is being seen today for the evaluation of possible FH at the request of Blair Heys, MD.  This is a pleasant 56 year old female with a long-standing history of elevated cholesterol.  There is a strong family history of dyslipidemia with her father who died of an MI at age 37.  Her mom also had elevated cholesterol.  She has a brother that she does not speak much with and 3 children, 2 daughters and 1 son all in their 69s who she does not believe have had cholesterol testing.  She has no known coronary disease, but has been on multiple cholesterol medications in the past including rosuvastatin, atorvastatin, simvastatin, pravastatin and ezetimibe.  All of which caused myalgias.  Recent lab work from April 2019 showed total cholesterol 340, HDL 60, LDL 258 and triglycerides 166.  Hemoglobin A1c of 6.4 suggestive of impaired fasting glucose.  Pressure has been well controlled.  Unfortunately she is also a smoker with a 20-pack-year smoking history and does have some mild COPD as well as hypothyroidism on levothyroxine and mild hypertension on lisinopril.  10/02/2021  [name] is here today to follow-up on her dyslipidemia.  I last saw her in 2019 but she is not considered a new patient.  She likely has a familial hyperlipidemia with LDL cholesterol that is been well over 200.  Unfortunately she has been intolerant of numerous statins.  We had discussed a PCSK9 inhibitor however cost was prohibitive and she was about to lose a job she had.  Subsequently she did find another job but unfortunately her husband was diagnosed with stage IV lung cancer.  In addition to that over the summer she seen in the ER with shortness of breath and found to have COPD.  She stopped smoking.  Her last lipid profile in August 2022 remained elevated with total cholesterol of  330, HDL 76, triglycerides of 147 and LDL 228.  02/05/2022  [name] returns today for follow-up.  She has had a marked improvement in her lipids on Praluent.  She says is very well-tolerated without any muscle side effects.  Her lipid NMR demonstrated LDL particle #1392, LDL-C115, HDL 71, triglycerides 187 and small LDL particle number of 442.  Her LDL cholesterol last summer was actually as high as 228.  Unfortunately, her insurance is changing to Repatha as the preferred PCSK9 inhibitor this summer.  We will go ahead and make that adjustment.  I would like a little bit better lipids as her particle numbers remain elevated.  We discussed options for therapy.  I would advise adding ezetimibe 10 mg daily to her regimen.  I did check an LP(a) which was negative.   06/03/2022  [name] is seen today in follow-up.  Per her insurance company she had switched from [employer] to [employer].  She is tolerating this well.  In addition I had added ezetimibe because she was not quite at target.  Unfortunately she was not able to get labs prior to this visit.  PMHx:  Past Medical History:  Diagnosis Date   Anemia    Arthritis    Complication of anesthesia    "wakes up fighting"   COPD (chronic obstructive pulmonary disease) (HCC)    Headache(784.0)    long time ago   History of blood transfusion    after tonsils removed  Hypertension    Hypothyroidism    Thyroid disease     Past Surgical History:  Procedure Laterality Date   APPENDECTOMY     BACK SURGERY     Laminectomy   CHOLECYSTECTOMY     ORIF ANKLE FRACTURE  07/19/2012   Procedure: OPEN REDUCTION INTERNAL FIXATION (ORIF) ANKLE FRACTURE;  Surgeon: Eldred Manges, MD;  Location: [facility] OR;  Service: Orthopedics;  Laterality: Right;  open reduction internal fixation right bimalleolar ankle fracture   TONSILLECTOMY     TYMPANOSTOMY TUBE PLACEMENT     WISDOM TOOTH EXTRACTION      FAMHx:  Family History  Problem Relation Age of Onset   Breast  cancer Paternal Aunt     SOCHx:   reports that she has quit smoking. Her smoking use included cigarettes. She has a 20.00 pack-year smoking history. She has never used smokeless tobacco. She reports that she does not currently use alcohol after a past usage of about 6.0 standard drinks of alcohol per week. She reports that she does not use drugs.  ALLERGIES:  Allergies  Allergen Reactions   Fenofibrate Other (See Comments)   Hydrocodone-Acetaminophen Other (See Comments)   Statins     ROS: Pertinent items noted in HPI and remainder of comprehensive ROS otherwise negative.  HOME MEDS: Current Outpatient Medications on File Prior to Visit  Medication Sig Dispense Refill   Ascorbic Acid (VITAMIN C) 100 MG tablet Take 100 mg by mouth daily.     CALCIUM PO Take 1 tablet by mouth daily as needed (supplement).     Cyanocobalamin (VITAMIN B 12) 500 MCG TABS 1 tablet     etodolac (LODINE) 400 MG tablet Take 400 mg by mouth 2 (two) times daily.  0   Evolocumab (REPATHA SURECLICK) 140 MG/ML SOAJ Inject 1 Dose into the skin every 14 (fourteen) days. 6 mL 2   ezetimibe (ZETIA) 10 MG tablet Take 1 tablet (10 mg total) by mouth daily. 90 tablet 3   fluticasone (FLONASE) 50 MCG/ACT nasal spray Place 1 spray into both nostrils daily as needed for allergies or rhinitis.     fluticasone furoate-vilanterol (BREO ELLIPTA) 100-25 MCG/ACT AEPB 1 PUFF INHALATION ONCE A DAY     glimepiride (AMARYL) 1 MG tablet 1 tablet with breakfast or the first main meal of the day     levothyroxine (SYNTHROID) 100 MCG tablet Take 100 mcg by mouth daily.     lisinopril (PRINIVIL,ZESTRIL) 10 MG tablet Take 10 mg by mouth daily.     metFORMIN (GLUCOPHAGE-XR) 500 MG 24 hr tablet 1 tablet     pantoprazole (PROTONIX) 40 MG tablet 1 tablet     triamcinolone cream (KENALOG) 0.1 % SMARTSIG:1 Application Topical 2-3 Times Daily     JARDIANCE 10 MG TABS tablet Take 10 mg by mouth daily.     No current facility-administered  medications on file prior to visit.    LABS/IMAGING: No results found for this or any previous visit (from the past 48 hour(s)).  No results found.  LIPID PANEL: No results found for: "CHOL", "TRIG", "HDL", "CHOLHDL", "VLDL", "LDLCALC", "LDLDIRECT"  WEIGHTS: Wt Readings from Last 3 Encounters:  06/03/22 183 lb 12.8 oz (83.4 kg)  02/05/22 181 lb 3.2 oz (82.2 kg)  10/02/21 176 lb 6.4 oz (80 kg)    VITALS: BP 132/89   Pulse 87   Ht 5\' 3"  (1.6 m)   Wt 183 lb 12.8 oz (83.4 kg)   LMP 05/03/2013   SpO2 98%  BMI 32.56 kg/m   EXAM: Deferred  EKG: Deferred  ASSESSMENT: Probable familial hyperlipidemia -Dutch score of 6 / Simon-Broome criteria suggests probable FH Hypertension Tobacco abuse Hypothyroidism Family history of premature coronary disease Statin myalgias  PLAN: 1.   [name] has transition from [employer] to [employer] per her insurance company recommendations.  I also added ezetimibe.  I expect her lipids are even better controlled at this point.  We will get a repeat lipid NMR, hopefully tomorrow and I will reach out to her with those results.  For the time being continue her current therapy.  Follow-up with me annually or sooner as necessary.  Chrystie Nose, MD, [facility], FACP  [place]  [facility]  Medical Director of the Advanced Lipid Disorders &  Cardiovascular Risk Reduction Clinic Diplomate of the American Board of Clinical Lipidology Attending Cardiologist  Direct Dial: [phone]  Fax: [phone]  Website:  [email or web address] Nicely Reatha Sur 06/03/2022, 4:18 PM

## 2022-06-03 NOTE — Patient Instructions (Signed)
Medication Instructions:  NO CHANGES today -- will depend on lab results  *If you need a refill on your cardiac medications before your next appointment, please call your pharmacy*   Lab Work: FASTING lab work as soon as able   FASTING lab work in 1  year -- before next visit  If you have labs (blood work) drawn today and your tests are completely normal, you will receive your results only by: [employer] (if you have MyChart) OR A paper copy in the mail If you have any lab test that is abnormal or we need to change your treatment, we will call you to review the results.   Testing/Procedures: NONE   Follow-Up: At [facility], you and your health needs are our priority.  As part of our continuing mission to provide you with exceptional heart care, we have created designated Provider Care Teams.  These Care Teams include your primary Cardiologist (physician) and Advanced Practice Providers (APPs -  Physician Assistants and Nurse Practitioners) who all work together to provide you with the care you need, when you need it.  We recommend signing up for the patient portal called "MyChart".  Sign up information is provided on this After Visit Summary.  MyChart is used to connect with patients for Virtual Visits (Telemedicine).  Patients are able to view lab/test results, encounter notes, upcoming appointments, etc.  Non-urgent messages can be sent to your provider as well.   To learn more about what you can do with MyChart, go to [email or web address].    Your next appointment:   12 month(s)  The format for your next appointment:   In Person  Provider:   Zoila Shutter MD

## 2022-06-05 LAB — NMR, LIPOPROFILE
Cholesterol, Total: 156 mg/dL (ref 100–199)
HDL Particle Number: 52.4 umol/L (ref >=30.5)
HDL-C: 78 mg/dL (ref >39)
LDL Particle Number: 853 nmol/L (ref <1000)
LDL Size: 20.1 nm — ABNORMAL LOW (ref >20.5)
LDL-C (NIH Calc): 57 mg/dL (ref 0–99)
LP-IR Score: 54 — ABNORMAL HIGH (ref <=45)
Small LDL Particle Number: 528 nmol/L — ABNORMAL HIGH (ref <=527)
Triglycerides: 125 mg/dL (ref 0–149)

## 2022-06-18 ENCOUNTER — Encounter: Payer: Self-pay | Admitting: Internal Medicine

## 2023-01-08 ENCOUNTER — Other Ambulatory Visit: Payer: Self-pay | Admitting: Internal Medicine

## 2023-01-24 ENCOUNTER — Other Ambulatory Visit (HOSPITAL_BASED_OUTPATIENT_CLINIC_OR_DEPARTMENT_OTHER): Payer: Self-pay | Admitting: Internal Medicine

## 2023-01-27 ENCOUNTER — Telehealth: Payer: Self-pay | Admitting: Internal Medicine

## 2023-01-27 DIAGNOSIS — E7849 Other hyperlipidemia: Secondary | ICD-10-CM

## 2023-01-27 NOTE — Telephone Encounter (Signed)
*  STAT* If patient is at the pharmacy, call can be transferred to refill team.   1. Which medications need to be refilled? (please list name of each medication and dose if known) Evolocumab (REPATHA SURECLICK) 140 MG/ML SOAJ   2. Which pharmacy/location (including street and city if local pharmacy) is medication to be sent to?  [id number] - [place], [place] - [address]    3. Do they need a 30 day or 90 day supply? 90 .

## 2023-01-28 MED ORDER — REPATHA SURECLICK 140 MG/ML ~~LOC~~ SOAJ: 140.0000 mg | SUBCUTANEOUS | 1 refills | Status: DC

## 2023-02-02 ENCOUNTER — Telehealth: Payer: Self-pay | Admitting: Internal Medicine

## 2023-02-02 NOTE — Telephone Encounter (Signed)
Patient aware will send this to PA team and will update her accordingly

## 2023-02-02 NOTE — Telephone Encounter (Signed)
Pt c/o medication issue:  1. Name of Medication:   Evolocumab (REPATHA SURECLICK) 140 MG/ML SOAJ    2. How are you currently taking this medication (dosage and times per day)? As written  3. Are you having a reaction (difficulty breathing--STAT)? no  4. What is your medication issue? Pt states she need prior auth for this medication.

## 2023-02-03 ENCOUNTER — Telehealth: Payer: Self-pay

## 2023-02-03 ENCOUNTER — Other Ambulatory Visit (HOSPITAL_COMMUNITY): Payer: Self-pay

## 2023-02-03 NOTE — Telephone Encounter (Signed)
Pharmacy Patient Advocate Encounter  Prior Authorization for Repatha 140mg /ml has been approved by [facility]ins).    KEY# [id number]  Effective dates: 5.7.24 through 5.7.25

## 2023-02-03 NOTE — Telephone Encounter (Signed)
LVM to please call office.  Has no My chart set up

## 2023-02-03 NOTE — Telephone Encounter (Signed)
Pharmacy Patient Advocate Encounter   Received notification from [facility] that prior authorization for REPATHA is needed.    PA submitted on 02/03/23 Key [id number] Status is pending  Haze Rushing, CPhT Pharmacy Patient Advocate Specialist Direct Number: [phone] Fax: [phone]

## 2023-02-05 NOTE — Telephone Encounter (Signed)
LVM that medication is approved and to reach out to pharmacy to fill.  If further questions please call the office.

## 2023-05-21 ENCOUNTER — Other Ambulatory Visit: Payer: Self-pay | Admitting: Internal Medicine

## 2023-05-21 DIAGNOSIS — E7849 Other hyperlipidemia: Secondary | ICD-10-CM

## 2023-07-25 ENCOUNTER — Other Ambulatory Visit (HOSPITAL_BASED_OUTPATIENT_CLINIC_OR_DEPARTMENT_OTHER): Payer: Self-pay | Admitting: Internal Medicine

## 2023-08-08 ENCOUNTER — Other Ambulatory Visit: Payer: Self-pay | Admitting: Internal Medicine

## 2023-08-08 DIAGNOSIS — E7849 Other hyperlipidemia: Secondary | ICD-10-CM

## 2023-08-19 ENCOUNTER — Other Ambulatory Visit (HOSPITAL_BASED_OUTPATIENT_CLINIC_OR_DEPARTMENT_OTHER): Payer: Self-pay | Admitting: Internal Medicine

## 2023-09-01 ENCOUNTER — Other Ambulatory Visit (HOSPITAL_BASED_OUTPATIENT_CLINIC_OR_DEPARTMENT_OTHER): Payer: Self-pay | Admitting: Internal Medicine

## 2023-09-12 ENCOUNTER — Encounter (HOSPITAL_BASED_OUTPATIENT_CLINIC_OR_DEPARTMENT_OTHER): Payer: Self-pay

## 2023-09-12 ENCOUNTER — Emergency Department (HOSPITAL_BASED_OUTPATIENT_CLINIC_OR_DEPARTMENT_OTHER): Payer: 59 | Admitting: Radiology

## 2023-09-12 ENCOUNTER — Other Ambulatory Visit: Payer: Self-pay

## 2023-09-12 ENCOUNTER — Emergency Department (HOSPITAL_BASED_OUTPATIENT_CLINIC_OR_DEPARTMENT_OTHER): Payer: 59

## 2023-09-12 ENCOUNTER — Emergency Department (HOSPITAL_BASED_OUTPATIENT_CLINIC_OR_DEPARTMENT_OTHER)
Admission: EM | Admit: 2023-09-12 | Discharge: 2023-09-12 | Disposition: A | Discharge status: Home / Self Care | Payer: 59 | Attending: Emergency Medicine | Admitting: Emergency Medicine

## 2023-09-12 DIAGNOSIS — M25561 Pain in right knee: Secondary | ICD-10-CM | POA: Insufficient documentation

## 2023-09-12 DIAGNOSIS — I10 Essential (primary) hypertension: Secondary | ICD-10-CM | POA: Insufficient documentation

## 2023-09-12 DIAGNOSIS — Z7951 Long term (current) use of inhaled steroids: Secondary | ICD-10-CM | POA: Insufficient documentation

## 2023-09-12 DIAGNOSIS — Y9301 Activity, walking, marching and hiking: Secondary | ICD-10-CM | POA: Diagnosis not present

## 2023-09-12 DIAGNOSIS — Z7984 Long term (current) use of oral hypoglycemic drugs: Secondary | ICD-10-CM | POA: Diagnosis not present

## 2023-09-12 DIAGNOSIS — E039 Hypothyroidism, unspecified: Secondary | ICD-10-CM | POA: Insufficient documentation

## 2023-09-12 DIAGNOSIS — Z79899 Other long term (current) drug therapy: Secondary | ICD-10-CM | POA: Diagnosis not present

## 2023-09-12 DIAGNOSIS — J449 Chronic obstructive pulmonary disease, unspecified: Secondary | ICD-10-CM | POA: Insufficient documentation

## 2023-09-12 DIAGNOSIS — W108XXA Fall (on) (from) other stairs and steps, initial encounter: Secondary | ICD-10-CM | POA: Diagnosis not present

## 2023-09-12 MED ORDER — IBUPROFEN 600 MG PO TABS: 600.0000 mg | ORAL_TABLET | Freq: Four times a day (QID) | ORAL | 0 refills | Status: AC | PRN

## 2023-09-12 NOTE — ED Triage Notes (Addendum)
Patient arrives ambulatory to ED with complaints of falling and injuring her right knee this morning.  Rates her pain a 5/10. Concerned about increased stiffness.  She did not hit her head or have LOC.

## 2023-09-12 NOTE — ED Provider Notes (Signed)
[facility] AT [facility] Provider Note   CSN: [id number] Arrival date & time: 09/12/23  7829     History  Chief Complaint  Patient presents with   Fall   Knee Pain    right    [name] is a 57 y.o. female.   Fall   57 year old female presents emergency department with complaints of fall.  Patient states that she was walking to take up the trash around 6 AM this morning when she missed a step when she is walking causing her to fall landing directly on the front part of her right knee.  Has been able to walk on leg since then but presents emergency department due to increasing stiffness when she was at work.  Denies trauma to head, LOC, blood thinner use.  Denies pain elsewhere.  Denies any chest pain, shortness of breath, abdominal pain, nausea, vomiting.  Denies feeling of knee instability/clicking/catching/popping.  Took an ibuprofen which has significantly helped her symptoms.  Wants to make sure that "nothing is broken."  Past medical history significant for hypothyroidism, hypertension, COPD, anemia  Home Medications Prior to Admission medications   Medication Sig Start Date End Date Taking? Authorizing Provider  ibuprofen (ADVIL) 600 MG tablet Take 1 tablet (600 mg total) by mouth every 6 (six) hours as needed. 09/12/23  Yes Sherian Maroon A, PA  Ascorbic Acid (VITAMIN C) 100 MG tablet Take 100 mg by mouth daily.    [provider]  CALCIUM PO Take 1 tablet by mouth daily as needed (supplement).    [provider]  Cyanocobalamin (VITAMIN B 12) 500 MCG TABS 1 tablet    [provider]  etodolac (LODINE) 400 MG tablet Take 400 mg by mouth 2 (two) times daily. 08/28/16   [provider]  Evolocumab (REPATHA SURECLICK) 140 MG/ML SOAJ INJECT 140 MG INTO THE SKIN EVERY 14 (FOURTEEN) DAYS. 08/10/23   End, Cristal Deer, MD  ezetimibe (ZETIA) 10 MG tablet Take 1 tablet (10 mg total) by mouth daily. Please  schedule appointment for future refills 09/03/23   Chrystie Nose, MD  fluticasone [facility]) 50 MCG/ACT nasal spray Place 1 spray into both nostrils daily as needed for allergies or rhinitis.    [provider]  fluticasone furoate-vilanterol (BREO ELLIPTA) 100-25 MCG/ACT AEPB 1 PUFF INHALATION ONCE A DAY    [provider]  glimepiride (AMARYL) 1 MG tablet 1 tablet with breakfast or the first main meal of the day 08/05/21   [provider]  JARDIANCE 10 MG TABS tablet Take 10 mg by mouth daily. 05/12/22   [provider]  levothyroxine (SYNTHROID) 100 MCG tablet Take 100 mcg by mouth daily. 02/04/22   [provider]  lisinopril (PRINIVIL,ZESTRIL) 10 MG tablet Take 10 mg by mouth daily.    [provider]  metFORMIN (GLUCOPHAGE-XR) 500 MG 24 hr tablet 1 tablet 04/30/21   [provider]  pantoprazole (PROTONIX) 40 MG tablet 1 tablet    [provider]  triamcinolone cream (KENALOG) 0.1 % SMARTSIG:1 Application Topical 2-3 Times Daily 05/08/22   [provider]      Allergies    Fenofibrate, Hydrocodone-acetaminophen, and Statins    Review of Systems   Review of Systems  All other systems reviewed and are negative.   Physical Exam Updated Vital Signs BP (!) 152/89 (BP Location: Right Arm)   Pulse 90   Temp 98 F (36.7 C) (Oral)   Resp 20   Ht  5\' 3"  (1.6 m)   Wt 81.6 kg   LMP 05/03/2013   SpO2 100%   BMI 31.89 kg/m  Physical Exam Vitals and nursing note reviewed.  Constitutional:      General: She is not in acute distress.    Appearance: She is well-developed.  HENT:     Head: Normocephalic and atraumatic.  Eyes:     Conjunctiva/sclera: Conjunctivae normal.  Cardiovascular:     Rate and Rhythm: Normal rate and regular rhythm.     Heart sounds: No murmur heard. Pulmonary:     Effort: Pulmonary effort is normal. No respiratory distress.     Breath sounds: Normal breath sounds.  Abdominal:      Palpations: Abdomen is soft.     Tenderness: There is no abdominal tenderness.  Musculoskeletal:        General: No swelling.     Cervical back: Neck supple.     Comments: No midline tenderness of cervical, thoracic, lumbar spine without step-off or deformity.  No chest wall tenderness.  Full range of motion of bilateral upper and lower extremities.  Patient with tenderness over patella as well as tibial tuberosity on right knee.  Superficial abrasions appreciated overlying right knee with some underlying ecchymosis.  No obvious palpable fluctuance/induration.  No obvious erythema.  Pedal pulses 2+ bilaterally.  No lower extremity edema.  Otherwise, no tenderness of upper or lower extremities.  Muscular strength 5 out of 5 upper and lower extremities.  Skin:    General: Skin is warm and dry.     Capillary Refill: Capillary refill takes less than 2 seconds.  Neurological:     Mental Status: She is alert.  Psychiatric:        Mood and Affect: Mood normal.     ED Results / Procedures / Treatments   Labs (all labs ordered are listed, but only abnormal results are displayed) Labs Reviewed - No data to display  EKG None  Radiology DG Knee Complete 4 Views Right Result Date: 09/12/2023 CLINICAL DATA:  Fall this morning.  Right knee injury and pain. EXAM: RIGHT KNEE - COMPLETE 4+ VIEW COMPARISON:  None Available. FINDINGS: No evidence of fracture, dislocation, or joint effusion. No evidence of arthropathy. Mild enthesopathic changes involving the patella. Soft tissues are unremarkable. IMPRESSION: No acute findings. Mild patellar enthesopathy. Electronically Signed   By: Danae Orleans M.D.   On: 09/12/2023 09:54    Procedures Procedures    Medications Ordered in ED Medications - No data to display  ED Course/ Medical Decision Making/ A&P                                 Medical Decision Making  This patient presents to the ED for concern of fall, this involves an extensive number of  treatment options, and is a complaint that carries with it a high risk of complications and morbidity.  The differential diagnosis includes fracture, strain/pain, dislocation, ligamentous/tendinous injury, neurovascular, mice, CVA, pneumothorax, hemothorax, solid organ damage, other   Co morbidities that complicate the patient evaluation  See HPI   Additional history obtained:  Additional history obtained from EMR External records from outside source obtained and reviewed including hospital records   Lab Tests:  N/a   Imaging Studies ordered:  I ordered imaging studies including x-ray right knee I independently visualized and interpreted imaging which showed no acute osseous abnormality.  Mild patellar enthesopathy I agree  with the radiologist interpretation   Cardiac Monitoring: / EKG:  The patient was maintained on a cardiac monitor.  I personally viewed and interpreted the cardiac monitored which showed an underlying rhythm of: Sinus rhythm   Consultations Obtained:  N/a   Problem List / ED Course / Critical interventions / Medication management  Right knee pain Reevaluation of the patient showed that the patient stayed the same I have reviewed the patients home medicines and have made adjustments as needed   Social Determinants of Health:  Former cigarette use.  Denies illicit drug use.   Test / Admission - Considered:  Right knee pain Vitals signs significant for hypertension blood pressure 152/89. Otherwise within normal range and stable throughout visit. Imaging studies significant for: See above 57 year old female presents emergency department after mechanical fall landing on her right knee.  Exam, patient with superficial abrasions as well as ecchymosis anterior right knee with some tenderness of right patella.  Full range of motion of right knee.  No evidence of neurovascular compromise distally.  X-ray obtained which is negative for any acute osseous  abnormality.  No joint laxity appreciated on exam.  Patient reassured by findings.  Recommend rest, ice, elevation, NSAIDs and follow-up with orthopedics if continued symptomatic once inflammatory changes from fall have resolved. Worrisome signs and symptoms were discussed with the patient, and the patient acknowledged understanding to return to the ED if noticed. Patient was stable upon discharge.          Final Clinical Impression(s) / ED Diagnoses Final diagnoses:  Acute pain of right knee    Rx / DC Orders ED Discharge Orders          Ordered    ibuprofen (ADVIL) 600 MG tablet  Every 6 hours PRN        09/12/23 1000              Peter Garter, [place] 09/12/23 1009    Tanda Rockers A, DO 09/14/23 [phone]

## 2023-09-12 NOTE — Discharge Instructions (Signed)
As discussed, x-ray negative for any fracture or dislocation.  Recommend continued use of anti-inflammatories at home for pain as well as ice over area.  Recommend elevation of your right leg to level of your heart to help with swelling.  You may also find a compressive knee brace beneficial; you may find this over-the-counter at your local pharmacy.  If you continue to have symptoms of right knee pain after swelling from initial trauma labs resolved, recommend follow-up with orthopedics in the outpatient setting; attached is their information.  Please do not hesitate to return to emergency department if the worrisome signs and symptoms we discussed to become apparent.

## 2023-09-18 ENCOUNTER — Other Ambulatory Visit (HOSPITAL_BASED_OUTPATIENT_CLINIC_OR_DEPARTMENT_OTHER): Payer: Self-pay | Admitting: Internal Medicine

## 2024-01-05 ENCOUNTER — Telehealth: Payer: Self-pay | Admitting: Pharmacy Technician

## 2024-01-05 NOTE — Telephone Encounter (Signed)
 We got a fax from the insurance saying repatha needs a pa but when doing one it said none required

## 2024-03-30 ENCOUNTER — Other Ambulatory Visit: Payer: Self-pay | Admitting: Family Medicine

## 2024-03-30 DIAGNOSIS — Z1231 Encounter for screening mammogram for malignant neoplasm of breast: Secondary | ICD-10-CM
